# Patient Record
Sex: Female | Born: 1950 | Race: White | Hispanic: No | Marital: Married | Smoking: Never smoker
Health system: Southern US, Community
[De-identification: ages and names within clinical notes are randomized; demographics above are authoritative.]

## PROBLEM LIST (undated history)

## (undated) DIAGNOSIS — E785 Hyperlipidemia, unspecified: Secondary | ICD-10-CM

## (undated) DIAGNOSIS — N200 Calculus of kidney: Secondary | ICD-10-CM

## (undated) DIAGNOSIS — Z9289 Personal history of other medical treatment: Secondary | ICD-10-CM

## (undated) DIAGNOSIS — R9431 Abnormal electrocardiogram [ECG] [EKG]: Secondary | ICD-10-CM

## (undated) DIAGNOSIS — M810 Age-related osteoporosis without current pathological fracture: Secondary | ICD-10-CM

## (undated) DIAGNOSIS — E079 Disorder of thyroid, unspecified: Secondary | ICD-10-CM

## (undated) DIAGNOSIS — I10 Essential (primary) hypertension: Secondary | ICD-10-CM

## (undated) DIAGNOSIS — R3129 Other microscopic hematuria: Secondary | ICD-10-CM

## (undated) DIAGNOSIS — E039 Hypothyroidism, unspecified: Secondary | ICD-10-CM

## (undated) DIAGNOSIS — Z87442 Personal history of urinary calculi: Secondary | ICD-10-CM

## (undated) HISTORY — DX: Abnormal electrocardiogram (ECG) (EKG): R94.31

## (undated) HISTORY — DX: Calculus of kidney: N20.0

## (undated) HISTORY — DX: Personal history of other medical treatment: Z92.89

## (undated) HISTORY — DX: Disorder of thyroid, unspecified: E07.9

## (undated) HISTORY — PX: CHOLECYSTECTOMY: SHX55

## (undated) HISTORY — DX: Hyperlipidemia, unspecified: E78.5

## (undated) HISTORY — DX: Age-related osteoporosis without current pathological fracture: M81.0

## (undated) HISTORY — DX: Essential (primary) hypertension: I10

## (undated) HISTORY — PX: COLONOSCOPY: SHX174

## (undated) HISTORY — PX: LITHOTRIPSY: SUR834

---

## 2005-06-11 ENCOUNTER — Ambulatory Visit: Payer: Self-pay | Admitting: Unknown Physician Specialty

## 2006-07-26 ENCOUNTER — Other Ambulatory Visit: Payer: Self-pay

## 2006-07-26 ENCOUNTER — Emergency Department: Payer: Self-pay | Admitting: Emergency Medicine

## 2007-11-01 ENCOUNTER — Ambulatory Visit: Payer: Self-pay | Admitting: Unknown Physician Specialty

## 2007-11-03 ENCOUNTER — Ambulatory Visit: Payer: Self-pay | Admitting: Unknown Physician Specialty

## 2008-12-05 ENCOUNTER — Ambulatory Visit: Payer: Self-pay | Admitting: Unknown Physician Specialty

## 2009-12-12 ENCOUNTER — Ambulatory Visit: Payer: Self-pay | Admitting: Unknown Physician Specialty

## 2010-01-02 ENCOUNTER — Ambulatory Visit: Payer: Self-pay | Admitting: Urology

## 2010-12-16 ENCOUNTER — Ambulatory Visit: Payer: Self-pay | Admitting: Unknown Physician Specialty

## 2011-06-30 ENCOUNTER — Ambulatory Visit: Payer: Self-pay | Admitting: Unknown Physician Specialty

## 2011-07-15 ENCOUNTER — Ambulatory Visit: Payer: Self-pay | Admitting: Surgery

## 2011-07-16 LAB — PATHOLOGY REPORT

## 2012-01-14 ENCOUNTER — Ambulatory Visit: Payer: Self-pay | Admitting: Unknown Physician Specialty

## 2013-02-07 ENCOUNTER — Ambulatory Visit: Payer: Self-pay | Admitting: Unknown Physician Specialty

## 2013-04-21 ENCOUNTER — Ambulatory Visit: Payer: Self-pay | Admitting: Gastroenterology

## 2013-04-24 LAB — PATHOLOGY REPORT

## 2013-08-30 ENCOUNTER — Ambulatory Visit: Payer: Self-pay

## 2013-10-06 ENCOUNTER — Ambulatory Visit: Payer: Self-pay

## 2014-02-21 ENCOUNTER — Ambulatory Visit: Payer: Self-pay | Admitting: Internal Medicine

## 2014-02-22 DIAGNOSIS — H9201 Otalgia, right ear: Secondary | ICD-10-CM | POA: Insufficient documentation

## 2014-02-22 DIAGNOSIS — F329 Major depressive disorder, single episode, unspecified: Secondary | ICD-10-CM | POA: Insufficient documentation

## 2014-02-22 DIAGNOSIS — F32A Depression, unspecified: Secondary | ICD-10-CM | POA: Insufficient documentation

## 2014-03-13 DIAGNOSIS — B029 Zoster without complications: Secondary | ICD-10-CM | POA: Insufficient documentation

## 2014-03-13 DIAGNOSIS — M792 Neuralgia and neuritis, unspecified: Secondary | ICD-10-CM | POA: Insufficient documentation

## 2014-03-17 ENCOUNTER — Emergency Department: Payer: Self-pay | Admitting: Emergency Medicine

## 2014-03-17 LAB — URINALYSIS, COMPLETE
BACTERIA: NONE SEEN
BILIRUBIN, UR: NEGATIVE
Blood: NEGATIVE
GLUCOSE, UR: NEGATIVE mg/dL (ref 0–75)
Ketone: NEGATIVE
LEUKOCYTE ESTERASE: NEGATIVE
Nitrite: NEGATIVE
Ph: 6 (ref 4.5–8.0)
Protein: NEGATIVE
RBC,UR: 2 /HPF (ref 0–5)
Specific Gravity: 1.017 (ref 1.003–1.030)
Squamous Epithelial: <1

## 2014-03-17 LAB — COMPREHENSIVE METABOLIC PANEL
ALT: 30 U/L (ref 12–78)
Albumin: 3.8 g/dL (ref 3.4–5.0)
Alkaline Phosphatase: 58 U/L
Anion Gap: 5 — ABNORMAL LOW (ref 7–16)
BUN: 22 mg/dL — ABNORMAL HIGH (ref 7–18)
Bilirubin,Total: 1.2 mg/dL — ABNORMAL HIGH (ref 0.2–1.0)
Calcium, Total: 9.1 mg/dL (ref 8.5–10.1)
Chloride: 107 mmol/L (ref 98–107)
Co2: 26 mmol/L (ref 21–32)
Creatinine: 0.84 mg/dL (ref 0.60–1.30)
EGFR (African American): >60
Glucose: 95 mg/dL (ref 65–99)
OSMOLALITY: 279 (ref 275–301)
Potassium: 4.5 mmol/L (ref 3.5–5.1)
SGOT(AST): 32 U/L (ref 15–37)
Sodium: 138 mmol/L (ref 136–145)
TOTAL PROTEIN: 7.3 g/dL (ref 6.4–8.2)

## 2014-03-17 LAB — CBC WITH DIFFERENTIAL/PLATELET
Basophil #: 0.1 10*3/uL (ref 0.0–0.1)
Basophil %: 0.8 %
EOS ABS: 0.1 10*3/uL (ref 0.0–0.7)
Eosinophil %: 0.9 %
HCT: 42 % (ref 35.0–47.0)
HGB: 14.1 g/dL (ref 12.0–16.0)
LYMPHS PCT: 27.5 %
Lymphocyte #: 2.5 10*3/uL (ref 1.0–3.6)
MCH: 31.4 pg (ref 26.0–34.0)
MCHC: 33.6 g/dL (ref 32.0–36.0)
MCV: 94 fL (ref 80–100)
MONOS PCT: 3.6 %
Monocyte #: 0.3 x10 3/mm (ref 0.2–0.9)
Neutrophil #: 6.1 10*3/uL (ref 1.4–6.5)
Neutrophil %: 67.2 %
Platelet: 206 10*3/uL (ref 150–440)
RBC: 4.48 10*6/uL (ref 3.80–5.20)
RDW: 12.8 % (ref 11.5–14.5)
WBC: 9.1 10*3/uL (ref 3.6–11.0)

## 2014-07-04 ENCOUNTER — Ambulatory Visit: Payer: Self-pay | Admitting: Urology

## 2014-07-05 ENCOUNTER — Ambulatory Visit: Payer: Self-pay | Admitting: Urology

## 2014-09-05 DIAGNOSIS — E782 Mixed hyperlipidemia: Secondary | ICD-10-CM | POA: Insufficient documentation

## 2015-01-31 ENCOUNTER — Other Ambulatory Visit: Payer: Self-pay | Admitting: Family Medicine

## 2015-02-22 ENCOUNTER — Other Ambulatory Visit: Payer: Self-pay | Admitting: Internal Medicine

## 2015-02-22 DIAGNOSIS — Z6832 Body mass index (BMI) 32.0-32.9, adult: Secondary | ICD-10-CM | POA: Insufficient documentation

## 2015-02-22 DIAGNOSIS — Z1231 Encounter for screening mammogram for malignant neoplasm of breast: Secondary | ICD-10-CM

## 2015-03-12 ENCOUNTER — Ambulatory Visit: Payer: Self-pay

## 2015-03-13 ENCOUNTER — Ambulatory Visit
Admission: RE | Admit: 2015-03-13 | Discharge: 2015-03-13 | Disposition: A | Discharge status: Home / Self Care | Payer: 59 | Source: Ambulatory Visit | Attending: Internal Medicine | Admitting: Internal Medicine

## 2015-03-13 DIAGNOSIS — Z1231 Encounter for screening mammogram for malignant neoplasm of breast: Secondary | ICD-10-CM | POA: Diagnosis not present

## 2015-05-28 DIAGNOSIS — F329 Major depressive disorder, single episode, unspecified: Secondary | ICD-10-CM | POA: Insufficient documentation

## 2015-05-28 DIAGNOSIS — F32A Depression, unspecified: Secondary | ICD-10-CM | POA: Insufficient documentation

## 2015-05-28 DIAGNOSIS — J3089 Other allergic rhinitis: Secondary | ICD-10-CM | POA: Insufficient documentation

## 2015-05-28 DIAGNOSIS — Z6833 Body mass index (BMI) 33.0-33.9, adult: Secondary | ICD-10-CM | POA: Insufficient documentation

## 2015-06-14 ENCOUNTER — Other Ambulatory Visit: Payer: Self-pay | Admitting: Family Medicine

## 2016-05-01 ENCOUNTER — Other Ambulatory Visit: Payer: Self-pay | Admitting: Internal Medicine

## 2016-05-01 DIAGNOSIS — Z1239 Encounter for other screening for malignant neoplasm of breast: Secondary | ICD-10-CM

## 2016-05-02 DIAGNOSIS — E782 Mixed hyperlipidemia: Secondary | ICD-10-CM | POA: Insufficient documentation

## 2016-05-02 DIAGNOSIS — E785 Hyperlipidemia, unspecified: Secondary | ICD-10-CM | POA: Insufficient documentation

## 2016-05-20 ENCOUNTER — Ambulatory Visit
Admission: RE | Admit: 2016-05-20 | Discharge: 2016-05-20 | Disposition: A | Discharge status: Home / Self Care | Payer: 59 | Source: Ambulatory Visit | Attending: Internal Medicine | Admitting: Internal Medicine

## 2016-05-20 ENCOUNTER — Other Ambulatory Visit: Payer: Self-pay | Admitting: Internal Medicine

## 2016-05-20 DIAGNOSIS — R928 Other abnormal and inconclusive findings on diagnostic imaging of breast: Secondary | ICD-10-CM | POA: Diagnosis not present

## 2016-05-20 DIAGNOSIS — Z1231 Encounter for screening mammogram for malignant neoplasm of breast: Secondary | ICD-10-CM | POA: Insufficient documentation

## 2016-05-20 DIAGNOSIS — Z1239 Encounter for other screening for malignant neoplasm of breast: Secondary | ICD-10-CM

## 2016-05-22 ENCOUNTER — Other Ambulatory Visit: Payer: Self-pay | Admitting: Internal Medicine

## 2016-05-22 DIAGNOSIS — N6310 Unspecified lump in the right breast, unspecified quadrant: Secondary | ICD-10-CM

## 2016-06-10 ENCOUNTER — Ambulatory Visit
Admission: RE | Admit: 2016-06-10 | Discharge: 2016-06-10 | Disposition: A | Discharge status: Home / Self Care | Payer: 59 | Source: Ambulatory Visit | Attending: Internal Medicine | Admitting: Internal Medicine

## 2016-06-10 DIAGNOSIS — N6310 Unspecified lump in the right breast, unspecified quadrant: Secondary | ICD-10-CM

## 2016-06-10 DIAGNOSIS — N63 Unspecified lump in breast: Secondary | ICD-10-CM | POA: Diagnosis present

## 2016-06-10 DIAGNOSIS — R928 Other abnormal and inconclusive findings on diagnostic imaging of breast: Secondary | ICD-10-CM | POA: Diagnosis not present

## 2016-08-03 DIAGNOSIS — N183 Chronic kidney disease, stage 3 unspecified: Secondary | ICD-10-CM | POA: Insufficient documentation

## 2016-08-09 DIAGNOSIS — F325 Major depressive disorder, single episode, in full remission: Secondary | ICD-10-CM | POA: Insufficient documentation

## 2017-04-28 DIAGNOSIS — J3089 Other allergic rhinitis: Secondary | ICD-10-CM | POA: Insufficient documentation

## 2017-04-28 DIAGNOSIS — I10 Essential (primary) hypertension: Secondary | ICD-10-CM | POA: Insufficient documentation

## 2017-05-05 ENCOUNTER — Ambulatory Visit: Payer: 59 | Admitting: Internal Medicine

## 2017-05-06 ENCOUNTER — Ambulatory Visit (INDEPENDENT_AMBULATORY_CARE_PROVIDER_SITE_OTHER): Payer: Managed Care, Other (non HMO) | Admitting: Internal Medicine

## 2017-05-06 ENCOUNTER — Other Ambulatory Visit: Payer: Self-pay | Admitting: Internal Medicine

## 2017-05-06 ENCOUNTER — Encounter: Payer: Self-pay | Admitting: Internal Medicine

## 2017-05-06 ENCOUNTER — Other Ambulatory Visit
Admission: RE | Admit: 2017-05-06 | Discharge: 2017-05-06 | Disposition: A | Discharge status: Home / Self Care | Payer: Managed Care, Other (non HMO) | Source: Ambulatory Visit | Attending: Internal Medicine | Admitting: Internal Medicine

## 2017-05-06 VITALS — BP 164/74 | HR 84 | Ht 66.0 in | Wt 205.2 lb

## 2017-05-06 DIAGNOSIS — I10 Essential (primary) hypertension: Secondary | ICD-10-CM | POA: Insufficient documentation

## 2017-05-06 DIAGNOSIS — R519 Headache, unspecified: Secondary | ICD-10-CM | POA: Insufficient documentation

## 2017-05-06 DIAGNOSIS — R51 Headache: Secondary | ICD-10-CM | POA: Diagnosis not present

## 2017-05-06 DIAGNOSIS — R609 Edema, unspecified: Secondary | ICD-10-CM

## 2017-05-06 DIAGNOSIS — R0602 Shortness of breath: Secondary | ICD-10-CM | POA: Diagnosis present

## 2017-05-06 DIAGNOSIS — R0609 Other forms of dyspnea: Secondary | ICD-10-CM | POA: Insufficient documentation

## 2017-05-06 LAB — URINALYSIS, COMPLETE (UACMP) WITH MICROSCOPIC
BACTERIA UA: NONE SEEN
BILIRUBIN URINE: NEGATIVE
Glucose, UA: NEGATIVE mg/dL
KETONES UR: NEGATIVE mg/dL
Leukocytes, UA: NEGATIVE
NITRITE: NEGATIVE
Protein, ur: >=300 mg/dL — AB
SPECIFIC GRAVITY, URINE: 1.019 (ref 1.005–1.030)
SQUAMOUS EPITHELIAL / LPF: NONE SEEN
pH: 6 (ref 5.0–8.0)

## 2017-05-06 LAB — COMPREHENSIVE METABOLIC PANEL
ALBUMIN: 4.2 g/dL (ref 3.5–5.0)
ALT: 25 U/L (ref 14–54)
AST: 26 U/L (ref 15–41)
Alkaline Phosphatase: 52 U/L (ref 38–126)
Anion gap: 10 (ref 5–15)
BUN: 22 mg/dL — ABNORMAL HIGH (ref 6–20)
CALCIUM: 9.3 mg/dL (ref 8.9–10.3)
CHLORIDE: 106 mmol/L (ref 101–111)
CO2: 25 mmol/L (ref 22–32)
CREATININE: 1.02 mg/dL — AB (ref 0.44–1.00)
GFR calc non Af Amer: 57 mL/min — ABNORMAL LOW (ref >60)
GLUCOSE: 100 mg/dL — AB (ref 65–99)
Potassium: 4.2 mmol/L (ref 3.5–5.1)
SODIUM: 141 mmol/L (ref 135–145)
Total Bilirubin: 1.7 mg/dL — ABNORMAL HIGH (ref 0.3–1.2)
Total Protein: 7.4 g/dL (ref 6.5–8.1)

## 2017-05-06 MED ORDER — HYDROCHLOROTHIAZIDE 25 MG PO TABS: 25.0000 mg | ORAL_TABLET | Freq: Every day | ORAL | 3 refills | Status: DC

## 2017-05-06 NOTE — Progress Notes (Signed)
New Outpatient Visit Date: 05/06/2017  Referring Provider: Linus Salmonshapman McQueen, MD [facility] [address] [place], [place] [id number]  Chief Complaint: Elevated blood pressure  HPI:  [name] is a 66 y.o. female who is being seen today for the evaluation of uncontrolled hypertension at the request of Jenne CampusMcQueen. She has a history of hypertension, hyperlipidemia, chronic kidney disease stage III, hypothyroidism, and obesity. She was seen by Dr. Jenne CampusMcQueen yesterday and was noted to have markedly elevated blood pressure (184/98). She has therefore been referred for urgent consultation.  [name] reports a 20 year history of hypertension that had been controlled with lisinopril up until about 3-4 months ago. At that time, she began having intermittent headaches and noted that her blood pressure was frequently elevated. Lisinopril was discontinued by her PCP and several additional agents have since been tried. Her home blood pressures have remained elevated, typically around 160/60-90. Most recently, she was started on hydralazine and nifedipine but has not experienced any significant improvement in her pressures. [name] has also noted progressive leg swelling over the last few months as well as some exertional shortness of breath when walking or going up stairs. She denies chest pain as well as orthopnea and PND. She notes occasional brief palpitations that she describes as a short pause. These have been present for at least a year. Since her headaches began, she is also noted intermittent lightheadedness. She has not passed out or fallen. She has been trying to drink more water and has noted increased urinary frequency. She consumes 3 cups of caffeinated coffee per day.  [name] describes frequent headaches that are present off and on throughout the day and even at night. They are bilateral and involve the frontal area as well as the occiput. She has not had any focal  weakness or paresthesia. She reports that ENT evaluation yesterday by Dr. Jenne CampusMcQueen was unrevealing. She has not undergone any brain imaging since onset of headaches.  [name] denies a history of prior cardiac disease. She reports having undergone an exercise stress echocardiogram about 3 years ago at [facility], which she believes was normal.  --------------------------------------------------------------------------------------------------  Cardiovascular History & Procedures: Cardiovascular Problems:  Uncontrolled hypertension  Risk Factors:  Hypertension, hyperlipidemia, and obesity  Cath/PCI:  None  CV Surgery:  None  EP Procedures and Devices:  None  Non-Invasive Evaluation(s):  Stress echocardiogram at [facility] approximately 3 years ago: Normal per patient report.  Recent CV Pertinent Labs: Lab Results  Component Value Date   K 4.5 03/17/2014   BUN 22 (H) 03/17/2014   CREATININE 0.84 03/17/2014    --------------------------------------------------------------------------------------------------  Past Medical History:  Diagnosis Date  . Hyperlipidemia   . Hypertension   . Osteoporosis     Past Surgical History:  Procedure Laterality Date  . CHOLECYSTECTOMY      Current Meds  Medication Sig  . escitalopram (LEXAPRO) 20 MG tablet TAKE 1 TABLET BY MOUTH ONCE DAILY  . fluticasone (FLONASE) 50 MCG/ACT nasal spray Place 1 spray into both nostrils daily.   . hydrALAZINE (APRESOLINE) 25 MG tablet Take 25 mg by mouth 3 (three) times daily.   Marland Kitchen. levothyroxine (SYNTHROID, LEVOTHROID) 50 MCG tablet Take 50 mcg by mouth daily before breakfast.   . lisinopril (PRINIVIL,ZESTRIL) 40 MG tablet TAKE 1 TABLET BY MOUTH TWO  TIMES DAILY  . Multiple Vitamin (MULTI-VITAMINS) TABS Take by mouth.  Marland Kitchen. NIFEdipine (PROCARDIA-XL/ADALAT CC) 30 MG 24 hr tablet Take one tablet  for two days then take 2 tablets daily.  . simvastatin (ZOCOR) 20 MG tablet TAKE 1 TABLET BY  MOUTH  NIGHTLY    Allergies: Codeine sulfate; Epinephrine; and Sulfa antibiotics  Social History   Social History  . Marital status: Married    Spouse name: N/A  . Number of children: N/A  . Years of education: N/A   Occupational History  . Not on file.   Social History Main Topics  . Smoking status: Never Smoker  . Smokeless tobacco: Never Used  . Alcohol use No  . Drug use: No  . Sexual activity: Not on file   Other Topics Concern  . Not on file   Social History Narrative  . No narrative on file    Family History  Problem Relation Age of Onset  . Cancer Brother   . Heart attack Father 28  . Breast cancer Neg Hx     Review of Systems: Ms. Rouillard noted some occasional cramping in her right leg when sitting still for extended periods (especially when driving). otherwise, a a12-system review of systems was performed and was negative except as noted in the HPI.  --------------------------------------------------------------------------------------------------  Physical Exam: BP (!) 164/74 (BP Location: Right Arm, Patient Position: Sitting, Cuff Size: Normal)   Pulse 84   Ht 5\' 6"  (1.676 m)   Wt 205 lb 4 oz (93.1 kg)   BMI 33.13 kg/m   General:  Obese woman, seated comfortably in the exam room. She is accompanied by her husband. HEENT: No conjunctival pallor or scleral icterus. Moist mucous membranes. OP clear. Neck: Supple without lymphadenopathy, thyromegaly, JVD, or HJR. No carotid bruit. Lungs: Normal work of breathing. Clear to auscultation bilaterally without wheezes or crackles. Heart: Regular rate and rhythm without murmurs, rubs, or gallops. Unable to assess PMI due to body habitus. Abd: Bowel sounds present. Soft, NT/ND without hepatosplenomegaly. Ext: 2+ pitting edema to the mid calves bilaterally. Radial, PT, and DP pulses are 2+ bilaterally Skin: Warm and dry without rash. Neuro: CNIII-XII intact. Strength and fine-touch sensation intact in upper and  lower extremities bilaterally. Psych: Normal mood and affect.  EKG:  Normal sinus rhythm with poor R-wave progression, inferior Q waves, and nonspecific ST/T changes. Poor R-wave progression and nonspecific ST/T changes are new since 07/04/14.  Lab Results  Component Value Date   WBC 9.1 03/17/2014   HGB 14.1 03/17/2014   HCT 42.0 03/17/2014   MCV 94 03/17/2014   PLT 206 03/17/2014    Lab Results  Component Value Date   NA 138 03/17/2014   K 4.5 03/17/2014   CL 107 03/17/2014   CO2 26 03/17/2014   BUN 22 (H) 03/17/2014   CREATININE 0.84 03/17/2014   GLUCOSE 95 03/17/2014   ALT 30 03/17/2014    No results found for: CHOL, HDL, LDLCALC, LDLDIRECT, TRIG, CHOLHDL  Outside labs (11/24/16): Sodium 143, potassium 4.5, chloride 106, CO2 31, BUN 21, creatinine 1.0, glucose 98, calcium 9.3, AST 16, ALT 14, alkaline phosphatase 41, total bilirubin 1.1, total protein 6.4, albumin 3.9.  --------------------------------------------------------------------------------------------------  ASSESSMENT AND PLAN: Uncontrolled hypertension  Blood pressure remains elevated today, albeit somewhat improved from yesterday. Given reported dramatic rise in blood pressure over the last 4 months or so, we must be concerned about a potential secondary causes. I would like to begin with CMP, urinalysis, serum aldosterone, plasma renin activity, and renal artery Doppler. I have also recommended a transthoracic echocardiogram to assess for structural abnormalities, particularly given accompanying dyspnea on exertion  and edema. We will initiate hydrochlorothiazide 25 mg daily today. I will not make any changes to nifedipine or hydralazine at this time.  Dyspnea on exertion This could be due to a number of factors including fluid retention and uncontrolled hypertension. EKG is abnormal with inferior Q waves (old) poor R-wave progression, nonspecific ST/T changes. We have agreed to evaluate this further with a  transthoracic echocardiogram. Based on the results, we may need to consider further ischemia evaluation with myocardial perfusion stress test or cardiac catheterization.  Lower extremity edema 2+ pitting edema noted in the legs up to the mid calves. This may of been exacerbated by addition of nifedipine. As above, I will check a CMP and urinalysis to exclude underlying liver and kidney pathology that may be contributing to this. We will also obtain a transthoracic echocardiogram. If these are unrevealing and edema persists, we will need to try switching from nifedipine to an alternative agent. Hopefully, addition of HCTZ will help as well.  Headaches These could be due to a number of factors. I am not sure that hypertension alone explains these. If headaches persist despite improved blood pressure control, I would suggest further workup, including brain imaging. I will defer this to [name].  Follow-up: Return to clinic in 1 month.  [name], MD 05/06/2017 8:30 AM

## 2017-05-06 NOTE — Patient Instructions (Signed)
Medication Instructions:  Your physician has recommended you make the following change in your medication:  1- START Hydrochlorothiazide (HCTZ) 25 mg (1 tablet) by mouth once a day.   Labwork: Your physician recommends that you return for lab work in: TODAY (CMP, Serum aldosterone/renin ratio, Urinalysis) - Please [facility] to the [facility]. You will check in at the front desk to the right as you walk into the atrium. Valet Parking is offered if needed.    Testing/Procedures: Your physician has requested that you have an echocardiogram. Echocardiography is a painless test that uses sound waves to create images of your heart. It provides your doctor with information about the size and shape of your heart and how well your heart's chambers and valves are working. This procedure takes approximately one hour. There are no restrictions for this procedure.  Your physician has requested that you have a renal artery duplex. During this test, an ultrasound is used to evaluate blood flow to the kidneys. Allow one hour for this exam. Do not eat after midnight the day before and avoid carbonated beverages. Take your medications as you usually do.    Follow-Up: Your physician recommends that you schedule a follow-up appointment in: 1 MONTH WITH DR END.  If you need a refill on your cardiac medications before your next appointment, please call your pharmacy.    Echocardiogram An echocardiogram, or echocardiography, uses sound waves (ultrasound) to produce an image of your heart. The echocardiogram is simple, painless, obtained within a short period of time, and offers valuable information to your health care provider. The images from an echocardiogram can provide information such as:  Evidence of coronary artery disease (CAD).  Heart size.  Heart muscle function.  Heart valve function.  Aneurysm detection.  Evidence of a past heart attack.  Fluid buildup around the heart.  Heart muscle  thickening.  Assess heart valve function.  Tell a health care provider about:  Any allergies you have.  All medicines you are taking, including vitamins, herbs, eye drops, creams, and over-the-counter medicines.  Any problems you or family members have had with anesthetic medicines.  Any blood disorders you have.  Any surgeries you have had.  Any medical conditions you have.  Whether you are pregnant or may be pregnant. What happens before the procedure? No special preparation is needed. Eat and drink normally. What happens during the procedure?  In order to produce an image of your heart, gel will be applied to your chest and a wand-like tool (transducer) will be moved over your chest. The gel will help transmit the sound waves from the transducer. The sound waves will harmlessly bounce off your heart to allow the heart images to be captured in real-time motion. These images will then be recorded.  You may need an IV to receive a medicine that improves the quality of the pictures. What happens after the procedure? You may return to your normal schedule including diet, activities, and medicines, unless your health care provider tells you otherwise. This information is not intended to replace advice given to you by your health care provider. Make sure you discuss any questions you have with your health care provider. Document Released: 09/11/2000 Document Revised: 05/02/2016 Document Reviewed: 05/22/2013 Elsevier Interactive Patient Education  2017 [employer]

## 2017-05-10 ENCOUNTER — Telehealth: Payer: Self-pay | Admitting: *Deleted

## 2017-05-10 LAB — ALDOSTERONE + RENIN ACTIVITY W/ RATIO
ALDO / PRA RATIO: 8.7 (ref 0.0–30.0)
ALDOSTERONE: 19.3 ng/dL (ref 0.0–30.0)
PRA LC/MS/MS: 2.215 ng/mL/hr (ref 0.167–5.380)

## 2017-05-10 NOTE — Telephone Encounter (Signed)
Returned call to patient. She verbalized understanding of results and plan of care. She verbalized understanding of appt date and time tomorrow with Dr Thedore MinsSingh. [facility] to [facility] provided to patient.

## 2017-05-10 NOTE — Telephone Encounter (Signed)
No answer. Left message to call back.   

## 2017-05-10 NOTE — Telephone Encounter (Signed)
Pt is returning your call

## 2017-05-10 NOTE — Telephone Encounter (Signed)
-----   Message from Yvonne Kendallhristopher End, MD sent at 05/08/2017  2:26 PM EDT ----- Please let [name] know that her blood and urine tests show mildly diminished kidney function as well as significant protein and microscopic blood in her urine. It is possible that underlying kidney issues have contributed to her recently hard to control hypertension. Please set her up for an urgent nephrology consultation this week to assess for nephrotic and/or nephritic syndrome. Thanks.

## 2017-05-10 NOTE — Telephone Encounter (Signed)
S/w Mandi at [facility] and placed urgent referral.  Appt scheduled for patient to see Dr Thedore MinsSingh on 05/11/17 at 2:40, arrival time of 2:20pm. Patient needs to bring medications and be prepared for a urine sample.   Left message with patient to call me for results and appt.

## 2017-05-17 ENCOUNTER — Ambulatory Visit: Payer: Managed Care, Other (non HMO)

## 2017-05-17 DIAGNOSIS — I10 Essential (primary) hypertension: Secondary | ICD-10-CM

## 2017-05-17 DIAGNOSIS — R609 Edema, unspecified: Secondary | ICD-10-CM

## 2017-05-17 DIAGNOSIS — R0602 Shortness of breath: Secondary | ICD-10-CM

## 2017-05-19 ENCOUNTER — Other Ambulatory Visit: Payer: Self-pay | Admitting: Nephrology

## 2017-05-19 DIAGNOSIS — R319 Hematuria, unspecified: Secondary | ICD-10-CM

## 2017-05-20 ENCOUNTER — Other Ambulatory Visit: Payer: Self-pay | Admitting: *Deleted

## 2017-05-20 ENCOUNTER — Telehealth: Payer: Self-pay | Admitting: *Deleted

## 2017-05-20 DIAGNOSIS — R0609 Other forms of dyspnea: Principal | ICD-10-CM

## 2017-05-20 NOTE — Telephone Encounter (Signed)
S/w receptionist at Dr Doristine Church office and she will request patient's last office visit notes, test, etc to be faxed to our office.

## 2017-05-20 NOTE — Telephone Encounter (Signed)
S/w patient. She verbalized understanding of results and plan of care. She verbalized understanding of Lexiscan myoview and was agreeable. Patient scheduled for Lexiscan on 06/08/17 at 0730. Patient did go to appt with Dr Thedore Mins on 05/11/17 and has additional appt to come including CT abdomin on 06/07/17. I will reach out to Dr Doristine Church office as office visit is not viewable in EPIC at this time.   Patient verbalized understanding of the instructions below as well:  [facility] MYOVIEW  Your caregiver has ordered a Stress Test with nuclear imaging. The purpose of this test is to evaluate the blood supply to your heart muscle. This procedure is referred to as a "Non-Invasive Stress Test." This is because other than having an IV started in your vein, nothing is inserted or "invades" your body. Cardiac stress tests are done to find areas of poor blood flow to the heart by determining the extent of coronary artery disease (CAD). Some patients exercise on a treadmill, which naturally increases the blood flow to your heart, while others who are  unable to walk on a treadmill due to physical limitations have a pharmacologic/chemical stress agent called Lexiscan . This medicine will mimic walking on a treadmill by temporarily increasing your coronary blood flow.   Please note: these test may take anywhere between 2-4 hours to complete  PLEASE REPORT TO [facility] ENTRANCE  THE VOLUNTEERS AT THE FIRST DESK WILL DIRECT YOU WHERE TO GO  Date of Procedure:_______09/11/18____________  Arrival Time for Procedure:______07:15 am  Instructions regarding medication:   Please take your usual morning medications with a small sip of water.   PLEASE NOTIFY THE OFFICE AT LEAST 24 HOURS IN ADVANCE IF YOU ARE UNABLE TO KEEP YOUR APPOINTMENT.  [phone] AND  PLEASE NOTIFY NUCLEAR MEDICINE AT [facility] AT LEAST 24 HOURS IN ADVANCE IF YOU ARE UNABLE TO KEEP YOUR APPOINTMENT. [phone]  How to prepare for your Myoview  test:  1. Do not eat or drink after midnight 2. No caffeine for 24 hours prior to test 3. No smoking 24 hours prior to test. 4. Your medication may be taken with water.  If your doctor stopped a medication because of this test, do not take that medication. 5. Ladies, please do not wear dresses.  Skirts or pants are appropriate. Please wear a short sleeve shirt. 6. No perfume, cologne or lotion. 7. Wear comfortable walking shoes. No heels!

## 2017-05-20 NOTE — Telephone Encounter (Signed)
-----   Message from [name], MD sent at 05/20/2017 10:38 AM EDT ----- Please let Ms. Munir know that her echo is normal. Given her exertional dyspnea, can we set her up for pharmacologic myocardial perfusion stress test? Also, do you know if we ever received a report from nephrology from our recent referral? Thanks.  Thayer Ohm

## 2017-06-01 NOTE — Telephone Encounter (Signed)
Requested office visit notes from Dr Leavy CellaJasmine Singh's office from 05/27/17. Then realized patient visit is viewable in EPIC for Dr Leotis ShamesJasmine Singh.   Will call Dr Mosetta PigeonHarmeet Singh with [facility].  Left message with nurse to fax over patient office visit notes and any additional information.

## 2017-06-02 NOTE — Telephone Encounter (Signed)
Records from Dr. Edmonia JamesHameet Singh and Dr. Leotis ShamesJasmine Singh for Dr End to review. Placed in his basket.

## 2017-06-07 ENCOUNTER — Ambulatory Visit
Admission: RE | Admit: 2017-06-07 | Discharge: 2017-06-07 | Disposition: A | Discharge status: Home / Self Care | Payer: Managed Care, Other (non HMO) | Source: Ambulatory Visit | Attending: Nephrology | Admitting: Nephrology

## 2017-06-07 DIAGNOSIS — K5730 Diverticulosis of large intestine without perforation or abscess without bleeding: Secondary | ICD-10-CM | POA: Diagnosis not present

## 2017-06-07 DIAGNOSIS — M419 Scoliosis, unspecified: Secondary | ICD-10-CM | POA: Diagnosis not present

## 2017-06-07 DIAGNOSIS — I700 Atherosclerosis of aorta: Secondary | ICD-10-CM | POA: Diagnosis not present

## 2017-06-07 DIAGNOSIS — K449 Diaphragmatic hernia without obstruction or gangrene: Secondary | ICD-10-CM | POA: Insufficient documentation

## 2017-06-07 DIAGNOSIS — N200 Calculus of kidney: Secondary | ICD-10-CM | POA: Insufficient documentation

## 2017-06-07 DIAGNOSIS — M4317 Spondylolisthesis, lumbosacral region: Secondary | ICD-10-CM | POA: Insufficient documentation

## 2017-06-07 DIAGNOSIS — Z87442 Personal history of urinary calculi: Secondary | ICD-10-CM | POA: Insufficient documentation

## 2017-06-07 DIAGNOSIS — I708 Atherosclerosis of other arteries: Secondary | ICD-10-CM | POA: Insufficient documentation

## 2017-06-07 DIAGNOSIS — R319 Hematuria, unspecified: Secondary | ICD-10-CM

## 2017-06-08 ENCOUNTER — Encounter
Admission: RE | Admit: 2017-06-08 | Discharge: 2017-06-08 | Disposition: A | Discharge status: Home / Self Care | Payer: Managed Care, Other (non HMO) | Source: Ambulatory Visit | Attending: Internal Medicine | Admitting: Internal Medicine

## 2017-06-08 DIAGNOSIS — R0609 Other forms of dyspnea: Secondary | ICD-10-CM

## 2017-06-08 LAB — NM MYOCAR MULTI W/SPECT W/WALL MOTION / EF
CSEPHR: 58 %
CSEPPHR: 92 {beats}/min
LV dias vol: 78 mL (ref 46–106)
LV sys vol: 29 mL
NUC STRESS TID: 1.12
Rest HR: 67 {beats}/min

## 2017-06-08 MED ORDER — TECHNETIUM TC 99M TETROFOSMIN IV KIT
31.8330 | PACK | Freq: Once | INTRAVENOUS | Status: AC | PRN
  Administered 2017-06-08: 31.833 via INTRAVENOUS

## 2017-06-08 MED ORDER — TECHNETIUM TC 99M TETROFOSMIN IV KIT
13.0000 | PACK | Freq: Once | INTRAVENOUS | Status: AC | PRN
  Administered 2017-06-08: 12.159 via INTRAVENOUS

## 2017-06-08 MED ORDER — REGADENOSON 0.4 MG/5ML IV SOLN
0.4000 mg | Freq: Once | INTRAVENOUS | Status: AC
  Administered 2017-06-08: 0.4 mg via INTRAVENOUS

## 2017-06-15 ENCOUNTER — Ambulatory Visit (INDEPENDENT_AMBULATORY_CARE_PROVIDER_SITE_OTHER): Payer: Managed Care, Other (non HMO) | Admitting: Internal Medicine

## 2017-06-15 ENCOUNTER — Encounter: Payer: Self-pay | Admitting: Internal Medicine

## 2017-06-15 VITALS — BP 130/86 | HR 76 | Ht 66.0 in | Wt 205.0 lb

## 2017-06-15 DIAGNOSIS — I10 Essential (primary) hypertension: Secondary | ICD-10-CM | POA: Diagnosis not present

## 2017-06-15 DIAGNOSIS — R809 Proteinuria, unspecified: Secondary | ICD-10-CM | POA: Diagnosis not present

## 2017-06-15 DIAGNOSIS — N182 Chronic kidney disease, stage 2 (mild): Secondary | ICD-10-CM | POA: Diagnosis not present

## 2017-06-15 DIAGNOSIS — R600 Localized edema: Secondary | ICD-10-CM | POA: Diagnosis not present

## 2017-06-15 MED ORDER — NIFEDIPINE ER OSMOTIC RELEASE 30 MG PO TB24: 30.0000 mg | ORAL_TABLET | Freq: Every day | ORAL | 3 refills | Status: DC

## 2017-06-15 NOTE — Patient Instructions (Signed)
Medication Instructions:  Your physician has recommended you make the following change in your medication:  1- DECREASE Nifedipine to 30 mg by mouth once a day.   Labwork: none  Testing/Procedures: none  Follow-Up: Your physician recommends that you schedule a follow-up appointment in: 6 WEEKS WITH DR END.   Any Other Special Instructions Will Be Listed Below (If Applicable).  Call us if blood pressure is greater than 150/90.   If you need a refill on your cardiac medications before your next appointment, please call your pharmacy.

## 2017-06-15 NOTE — Progress Notes (Signed)
Follow-up Outpatient Visit Date: 06/15/2017  Primary Care Provider: Leotis Shames, MD [address] [address] [facility] [place] [address]  Chief Complaint: Leg swelling  HPI:  Tracey Mosley is a 66 y.o. year-old female with history of hypertension, hyperlipidemia, chronic kidney disease stage III, hypothyroidism, and obesity, who presents for follow-up of hypertension. I last saw Ms. Dressel in early August for urgent evaluation of uncontrolled hypertension. Workup at that time revealed proteinuria and microscopic hematuria prompting referral to nephrology. Subsequent evaluation has revealed a left ureteral stone for which neurologic assessment is pending. We added HCTZ to her regimen at that time, which Ms. Sandeen has been tolerating well. She notes that her home blood pressures have improved, though they remain mildly elevated. She also reported exertional dyspnea, with subsequent echocardiogram showing no significant abnormalities. Myocardial perfusion stress test was also low risk without evidence of ischemia or scar.  Today, Tracey Mosley reports feeling well with the exception of some continued leg swelling. She has been exercising and happily reports that she has lost about 5 pounds over the last month. She is also been watching her sodium intake. She denies chest pain, palpitations, lightheadedness, orthopnea, and PND. Exertional dyspnea has also improved.  --------------------------------------------------------------------------------------------------  Cardiovascular History & Procedures: Cardiovascular Problems:  Uncontrolled hypertension  Risk Factors:  Hypertension, hyperlipidemia, and obesity  Cath/PCI:  None.  CV Surgery:  None.  EP Procedures and Devices:  None.  Non-Invasive Evaluation(s):  Pharmacologic MPI (06/08/17): Low risk study without ischemia or scar. Normal LVEF.  TTE (05/18/17): Normal LV size and function. LVEF 55-60%. Normal RV size and  function. No significant valvular abnormalities.  Recent CV Pertinent Labs: Lab Results  Component Value Date   K 4.2 05/06/2017   K 4.5 03/17/2014   BUN 22 (H) 05/06/2017   BUN 22 (H) 03/17/2014   CREATININE 1.02 (H) 05/06/2017   CREATININE 0.84 03/17/2014    Past medical and surgical history were reviewed and updated in EPIC.  Current Meds  Medication Sig  . escitalopram (LEXAPRO) 20 MG tablet TAKE 1 TABLET BY MOUTH ONCE DAILY  . fluticasone (FLONASE) 50 MCG/ACT nasal spray Place 1 spray into both nostrils daily.   . hydrALAZINE (APRESOLINE) 25 MG tablet Take 25 mg by mouth 3 (three) times daily.   . hydrochlorothiazide (HYDRODIURIL) 25 MG tablet Take 1 tablet (25 mg total) by mouth daily.  Marland Kitchen levothyroxine (SYNTHROID, LEVOTHROID) 50 MCG tablet Take 50 mcg by mouth daily before breakfast.   . lisinopril (PRINIVIL,ZESTRIL) 40 MG tablet Take 40 mg by mouth daily.  . Multiple Vitamin (MULTI-VITAMINS) TABS Take by mouth.  . simvastatin (ZOCOR) 20 MG tablet TAKE 1 TABLET BY MOUTH  NIGHTLY  . [DISCONTINUED] NIFEdipine (PROCARDIA-XL/ADALAT CC) 30 MG 24 hr tablet Take one tablet for two days then take 2 tablets daily.    Allergies: Codeine sulfate; Epinephrine; and Sulfa antibiotics  Social History   Social History  . Marital status: Married    Spouse name: N/A  . Number of children: N/A  . Years of education: N/A   Occupational History  . Not on file.   Social History Main Topics  . Smoking status: Never Smoker  . Smokeless tobacco: Never Used  . Alcohol use No  . Drug use: No  . Sexual activity: Not on file   Other Topics Concern  . Not on file   Social History Narrative  . No narrative on file    Family History  Problem Relation Age of Onset  .  Cancer Brother        Abdomina  . Heart disease Mother        leaky valves  . Heart attack Father 44  . Aneurysm Father   . Breast cancer Neg Hx     Review of Systems: A 12-system review of systems was performed  and was negative except as noted in the HPI.  --------------------------------------------------------------------------------------------------  Physical Exam: BP 130/86 (BP Location: Left Arm, Patient Position: Sitting, Cuff Size: Large)   Pulse 76   Ht  (1.676 m)   Wt 205 lb (93 kg)   BMI 33.09 kg/m   General:  Obese woman, seated comfortably in the exam room. She is accompanied by her husband. HEENT: No conjunctival pallor or scleral icterus. Moist mucous membranes.  OP clear. Neck: Supple without lymphadenopathy, thyromegaly, JVD, or HJR. Lungs: Normal work of breathing. Clear to auscultation bilaterally without wheezes or crackles. Heart: Regular rate and rhythm without murmurs, rubs, or gallops. Non-displaced PMI. Abd: Bowel sounds present. Soft, NT/ND without hepatosplenomegaly Ext: 2+ edema to the mid calves. Radial, PT, and DP pulses are 2+ bilaterally. Skin: Warm and dry without rash.  Lab Results  Component Value Date   WBC 9.1 03/17/2014   HGB 14.1 03/17/2014   HCT 42.0 03/17/2014   MCV 94 03/17/2014   PLT 206 03/17/2014    Lab Results  Component Value Date   NA 141 05/06/2017   K 4.2 05/06/2017   CL 106 05/06/2017   CO2 25 05/06/2017   BUN 22 (H) 05/06/2017   CREATININE 1.02 (H) 05/06/2017   GLUCOSE 100 (H) 05/06/2017   ALT 25 05/06/2017    No results found for: CHOL, HDL, LDLCALC, LDLDIRECT, TRIG, CHOLHDL  Outside labs (05/11/17): Sodium 141, potassium 3.7, bicarbonate 27, BUN 20, creatinine 0.97, hemoglobin 14.7, platelets 264, total cholesterol 188, triglycerides 239, HDL 52, LDL 88.  Urine protein to creatinine ratio 2,032 mg/g --------------------------------------------------------------------------------------------------  ASSESSMENT AND PLAN: Hypertension Blood pressure is better controlled today, upper normal to mildly elevated. I will continue HCTZ and lisinopril. We will decrease nifedipine to 30 mg daily in case this is contributing to  her leg edema. I have asked Ms. Cogburn to closely monitor her blood pressure and to let us know if it begins to rise. I encouraged her to continue with exercise and salt restriction.  Lower extremity edema This is likely multifactorial, including side effects from nifedipine as well as underlying kidney disease and proteinuria. Echo did not show any significant abnormalities. As above, we will decrease nifedipine.  Chronic kidney disease with proteinuria Repeat creatinine by [name] after initiation of HCTZ was stable. Significant proteinuria was identified. Workup also revealed a left renal stone. I will defer further workup to [name].  Follow-up: Return to clinic in 6 weeks.  [name], MD 06/16/2017 6:51 AM

## 2017-06-16 ENCOUNTER — Encounter: Payer: Self-pay | Admitting: Internal Medicine

## 2017-06-17 ENCOUNTER — Other Ambulatory Visit: Payer: Self-pay

## 2017-06-17 DIAGNOSIS — N200 Calculus of kidney: Secondary | ICD-10-CM

## 2017-06-18 ENCOUNTER — Encounter: Payer: Self-pay | Admitting: Urology

## 2017-06-18 ENCOUNTER — Other Ambulatory Visit
Admission: RE | Admit: 2017-06-18 | Discharge: 2017-06-18 | Disposition: A | Discharge status: Home / Self Care | Payer: Managed Care, Other (non HMO) | Source: Ambulatory Visit | Attending: Urology | Admitting: Urology

## 2017-06-18 ENCOUNTER — Ambulatory Visit (INDEPENDENT_AMBULATORY_CARE_PROVIDER_SITE_OTHER): Payer: Managed Care, Other (non HMO) | Admitting: Urology

## 2017-06-18 VITALS — BP 134/67 | HR 85 | Ht 66.0 in | Wt 204.0 lb

## 2017-06-18 DIAGNOSIS — R3129 Other microscopic hematuria: Secondary | ICD-10-CM

## 2017-06-18 DIAGNOSIS — N200 Calculus of kidney: Secondary | ICD-10-CM

## 2017-06-18 LAB — URINALYSIS, COMPLETE (UACMP) WITH MICROSCOPIC
Bilirubin Urine: NEGATIVE
Glucose, UA: NEGATIVE mg/dL
Ketones, ur: NEGATIVE mg/dL
Leukocytes, UA: NEGATIVE
Nitrite: NEGATIVE
PH: 6.5 (ref 5.0–8.0)
Protein, ur: 100 mg/dL — AB
SPECIFIC GRAVITY, URINE: 1.025 (ref 1.005–1.030)

## 2017-06-18 NOTE — Progress Notes (Signed)
06/18/2017 9:07 PM   Tracey Mosley 1951/08/21 [id number]  Referring provider: Leotis Shames, MD [address] [facility] [address] [address] [facility] [place], [place] [address]  Chief Complaint  Patient presents with  . Nephrolithiasis    New Patient    HPI: 66 yo F with history of nephrolithiasis referred for evaluation of kidney stone.  She was referred to nephrology ([name]) for her further evaluation of proteinuria and microscopic.  As part of the work up, she underwent CT abdomen pelvis w/o contrast on 05/2017 which showed 6 mm LLP nonobtructing stone.   There was also incidental bladder wall thickening.    She does have occational left flank pain which is mild.  No gross hematuria.    She is s/p ESWL x 2 (~2013 and 2015) in the past for stones previously followed by Dr. Evelene Croon.  She does no know what type of stone she makes.    Never smoker.   No previous hematuria work up of cystoscopy.    PMH: Past Medical History:  Diagnosis Date  . Hyperlipidemia   . Hypertension   . Kidney stones   . Osteoporosis   . Thyroid disease     Surgical History: Past Surgical History:  Procedure Laterality Date  . CHOLECYSTECTOMY    . LITHOTRIPSY      Home Medications:  Allergies as of 06/18/2017      Reactions   Codeine Sulfate Nausea Only   Epinephrine Other (See Comments)   Tremors   Sulfa Antibiotics Nausea And Vomiting      Medication List       Accurate as of 06/18/17 11:59 PM. Always use your most recent med list.          escitalopram 20 MG tablet Commonly known as:  LEXAPRO TAKE 1 TABLET BY MOUTH ONCE DAILY   fluticasone 50 MCG/ACT nasal spray Commonly known as:  FLONASE Place 1 spray into both nostrils daily.   hydrALAZINE 25 MG tablet Commonly known as:  APRESOLINE Take 25 mg by mouth 3 (three) times daily.   hydrochlorothiazide 25 MG tablet Commonly known as:  HYDRODIURIL Take 1 tablet (25 mg total) by mouth daily.   levothyroxine 50 MCG tablet Commonly  known as:  SYNTHROID, LEVOTHROID Take 50 mcg by mouth daily before breakfast.   lisinopril 40 MG tablet Commonly known as:  PRINIVIL,ZESTRIL Take 40 mg by mouth daily.   MULTI-VITAMINS Tabs Take by mouth.   NIFEdipine 30 MG 24 hr tablet Commonly known as:  PROCARDIA-XL/ADALAT-CC/NIFEDICAL-XL Take 1 tablet (30 mg total) by mouth daily.   simvastatin 20 MG tablet Commonly known as:  ZOCOR TAKE 1 TABLET BY MOUTH  NIGHTLY       Allergies:  Allergies  Allergen Reactions  . Codeine Sulfate Nausea Only  . Epinephrine Other (See Comments)    Tremors  . Sulfa Antibiotics Nausea And Vomiting    Family History: Family History  Problem Relation Age of Onset  . Cancer Brother        Abdomina  . Heart disease Mother        leaky valves  . Heart attack Father 26  . Aneurysm Father   . Breast cancer Neg Hx     Social History:  reports that she has never smoked. She has never used smokeless tobacco. She reports that she does not drink alcohol or use drugs.  ROS: UROLOGY Frequent Urination?: No Hard to postpone urination?: No Burning/pain with urination?: No Get up at night to urinate?:  Yes Leakage of urine?: Yes Urine stream starts and stops?: No Trouble starting stream?: No Do you have to strain to urinate?: No Blood in urine?: No Urinary tract infection?: No Sexually transmitted disease?: No Injury to kidneys or bladder?: No Painful intercourse?: No Weak stream?: No Currently pregnant?: No Vaginal bleeding?: No Last menstrual period?: n  Gastrointestinal Nausea?: No Vomiting?: No Indigestion/heartburn?: No Diarrhea?: No Constipation?: No  Constitutional Fever: No Night sweats?: No Weight loss?: No Fatigue?: No  Skin Skin rash/lesions?: No Itching?: No  Eyes Blurred vision?: Yes Double vision?: No  Ears/Nose/Throat Sore throat?: No Sinus problems?: Yes  Hematologic/Lymphatic Swollen glands?: No Easy bruising?: No  Cardiovascular Leg  swelling?: No Chest pain?: No  Respiratory Cough?: No Shortness of breath?: Yes  Endocrine Excessive thirst?: No  Musculoskeletal Back pain?: Yes Joint pain?: No  Neurological Headaches?: Yes Dizziness?: No  Psychologic Depression?: No Anxiety?: Yes  Physical Exam: BP 134/67   Pulse 85   Ht  (1.676 m)   Wt 204 lb (92.5 kg)   BMI 32.93 kg/m   Constitutional:  Alert and oriented, No acute distress. HEENT: [place] AT, moist mucus membranes.  Trachea midline, no masses. Cardiovascular: No clubbing, cyanosis, or edema. Respiratory: Normal respiratory effort, no increased work of breathing. GI: Abdomen is soft, nontender, nondistended, no abdominal masses GU: No CVA tenderness.  Skin: No rashes, bruises or suspicious lesions.. Neurologic: Grossly intact, no focal deficits, moving all 4 extremities. Psychiatric: Normal mood and affect.  Laboratory Data: Lab Results  Component Value Date   WBC 9.1 03/17/2014   HGB 14.1 03/17/2014   HCT 42.0 03/17/2014   MCV 94 03/17/2014   PLT 206 03/17/2014    Lab Results  Component Value Date   CREATININE 1.02 (H) 05/06/2017    Urinalysis Results for orders placed or performed during the hospital encounter of 06/18/17  Urinalysis, Complete w Microscopic  Result Value Ref Range   Color, Urine YELLOW YELLOW   APPearance HAZY (A) CLEAR   Specific Gravity, Urine 1.025 1.005 - 1.030   pH 6.5 5.0 - 8.0   Glucose, UA NEGATIVE NEGATIVE mg/dL   Hgb urine dipstick LARGE (A) NEGATIVE   Bilirubin Urine NEGATIVE NEGATIVE   Ketones, ur NEGATIVE NEGATIVE mg/dL   Protein, ur 409 (A) NEGATIVE mg/dL   Nitrite NEGATIVE NEGATIVE   Leukocytes, UA NEGATIVE NEGATIVE   Squamous Epithelial / LPF 0-5 (A) NONE SEEN   WBC, UA 0-5 0 - 5 WBC/hpf   RBC / HPF 6-30 0 - 5 RBC/hpf   Bacteria, UA FEW (A) NONE SEEN   Granular Casts, UA PRESENT     Pertinent Imaging: CLINICAL DATA:  Microscopic hematuria  EXAM: CT ABDOMEN AND PELVIS WITHOUT  CONTRAST  TECHNIQUE: Multidetector CT imaging of the abdomen and pelvis was performed following the standard protocol without oral or intravenous contrast material administration.  COMPARISON:  March 17, 2014  FINDINGS: Lower chest: There is a stable 5 mm nodular opacity in the lateral segment of the right lower lobe inferiorly. Lung bases otherwise are clear. There is a small hiatal hernia.  Hepatobiliary: No focal liver lesions are evident. Gallbladder wall is not appreciably thickened. Common bile duct is within normal limits given post cholecystectomy state. No biliary duct mass or calculus is appreciable on this study.  Pancreas: There is no pancreatic mass or inflammatory focus.  Spleen: No splenic lesions are evident.  Adrenals/Urinary Tract: Adrenals appear normal bilaterally. There is no appreciable renal mass or hydronephrosis on either side.  There is a 6 x 3 mm nonobstructing calculus in the lower pole of the left kidney. There is no ureteral calculus on either side. Urinary bladder is midline. There is thickening of the urinary bladder wall.  Stomach/Bowel: There is slight sigmoid diverticulosis without diverticulitis. There is no appreciable bowel wall or mesenteric thickening. There is no evident bowel obstruction. No free air or portal venous air.  Vascular/Lymphatic: There is atherosclerotic calcification in the aorta and right common iliac artery. There is no demonstrable aneurysm. Major mesenteric vessels appear patent on this noncontrast enhanced study. There is no appreciable adenopathy in the abdomen or pelvis.  Reproductive: Uterus is anteverted. There is no evident pelvic mass.  Other: Appendix appears normal. There is no abscess or ascites in the abdomen or pelvis. There is thinning of the rectus muscle in the midline. A loop of colon extends into this area without bowel compromise.  Musculoskeletal: There is thoracolumbar  levoscoliosis. There is slight anterolisthesis of L5 on S1 There is degenerative change in the lumbar spine. There are no blastic or lytic bone lesions. There is no intramuscular or abdominal wall lesions.  IMPRESSION: 1. 6 x 3 mm calculus lower pole left kidney without hydronephrosis. There is no hydronephrosis or ureteral calculus on either side.  2. Urinary bladder wall thickening, a finding felt to be indicative of cystitis.  3. Slight sigmoid diverticulosis. No diverticulitis. No bowel wall obstruction. Appendix appears normal. No abscess.  4. Small hiatal hernia. Thinning of the rectus muscle in the midline. Loop of colon extends into this area without bowel compromise.  5.  Aortoiliac atherosclerosis.  6. Scoliosis. Mild spondylolisthesis at L5-S1 with degenerative change in the lumbar spine.  7.  Gallbladder absent.  Aortic Atherosclerosis (ICD10-I70.0).   Electronically Signed   By: Bretta Bang III M.D.   On: 06/07/2017 10:58  CT abd/ pelvis reviewed personally today and with the patient  Assessment & Plan:    1. Nephrolithiasis 6 mm LLP stone, possibly symptomatic at times We discussed various treatment options including ESWL vs. ureteroscopy, laser lithotripsy, and stent vs. Continued observation. We discussed the risks and benefits of both including bleeding, infection, damage to surrounding structures, efficacy with need for possible further intervention, and need for temporary ureteral stent.  We discussed general stone prevention techniques including drinking plenty water with goal of producing 2.5 L urine daily, increased citric acid intake, avoidance of high oxalate containing foods, and decreased salt intake.  Information about dietary recommendations given today.   She would likely to observe for the time being, reassess in the future following work up of #2  2. Microscopic hematuria Likely related to stone or underlying renal disease,  however, strongly recommend cystoscopy  No delayed ureteral imaging, but given non smoking history, low risk for up upper tract TCC Aggreable to cystoscopy/ cytology   Return in about 2 weeks (around 07/02/2017) for cysto.  Vanna Scotland, MD  [facility] [address], [address] [place], [place] [address] [phone]

## 2017-06-18 NOTE — Patient Instructions (Signed)
Dietary Guidelines to Help Prevent Kidney Stones Kidney stones are deposits of minerals and salts that form inside your kidneys. Your risk of developing kidney stones may be greater depending on your diet, your lifestyle, the medicines you take, and whether you have certain medical conditions. Most people can reduce their chances of developing kidney stones by following the instructions below. Depending on your overall health and the type of kidney stones you tend to develop, your dietitian may give you more specific instructions. What are tips for following this plan? Reading food labels   Choose foods with "no salt added" or "low-salt" labels. Limit your sodium intake to less than 1500 mg per day.  Choose foods with calcium for each meal and snack. Try to eat about 300 mg of calcium at each meal. Foods that contain 200-500 mg of calcium per serving include:  8 oz (237 ml) of milk, fortified nondairy milk, and fortified fruit juice.  8 oz (237 ml) of kefir, yogurt, and soy yogurt.  4 oz (118 ml) of tofu.  1 oz of cheese.  1 cup (300 g) of dried figs.  1 cup (91 g) of cooked broccoli.  1-3 oz can of sardines or mackerel.  Most people need 1000 to 1500 mg of calcium each day. Talk to your dietitian about how much calcium is recommended for you. Shopping   Buy plenty of fresh fruits and vegetables. Most people do not need to avoid fruits and vegetables, even if they contain nutrients that may contribute to kidney stones.  When shopping for convenience foods, choose:  Whole pieces of fruit.  Premade salads with dressing on the side.  Low-fat fruit and yogurt smoothies.  Avoid buying frozen meals or prepared deli foods.  Look for foods with live cultures, such as yogurt and kefir. Cooking   Do not add salt to food when cooking. Place a salt shaker on the table and allow each person to add his or her own salt to taste.  Use vegetable protein, such as beans, textured vegetable  protein (TVP), or tofu instead of meat in pasta, casseroles, and soups. Meal planning   Eat less salt, if told by your dietitian. To do this:  Avoid eating processed or premade food.  Avoid eating fast food.  Eat less animal protein, including cheese, meat, poultry, or fish, if told by your dietitian. To do this:  Limit the number of times you have meat, poultry, fish, or cheese each week. Eat a diet free of meat at least 2 days a week.  Eat only one serving each day of meat, poultry, fish, or seafood.  When you prepare animal protein, cut pieces into small portion sizes. For most meat and fish, one serving is about the size of one deck of cards.  Eat at least 5 servings of fresh fruits and vegetables each day. To do this:  Keep fruits and vegetables on hand for snacks.  Eat 1 piece of fruit or a handful of berries with breakfast.  Have a salad and fruit at lunch.  Have two kinds of vegetables at dinner.  Limit foods that are high in a substance called oxalate. These include:  Spinach.  Rhubarb.  Beets.  Potato chips and french fries.  Nuts.  If you regularly take a diuretic medicine, make sure to eat at least 1-2 fruits or vegetables high in potassium each day. These include:  Avocado.  Banana.  Orange, prune, carrot, or tomato juice.  Baked potato.  Cabbage.    Beans and split peas. General instructions   Drink enough fluid to keep your urine clear or pale yellow. This is the most important thing you can do.  Talk to your health care provider and dietitian about taking daily supplements. Depending on your health and the cause of your kidney stones, you may be advised:  Not to take supplements with vitamin C.  To take a calcium supplement.  To take a daily probiotic supplement.  To take other supplements such as magnesium, fish oil, or vitamin B6.  Take all medicines and supplements as told by your health care provider.  Limit alcohol intake to no  more than 1 drink a day for nonpregnant women and 2 drinks a day for men. One drink equals 12 oz of beer, 5 oz of wine, or 1 oz of hard liquor.  Lose weight if told by your health care provider. Work with your dietitian to find strategies and an eating plan that works best for you. What foods are not recommended? Limit your intake of the following foods, or as told by your dietitian. Talk to your dietitian about specific foods you should avoid based on the type of kidney stones and your overall health. Grains  Breads. Bagels. Rolls. Baked goods. Salted crackers. Cereal. Pasta. Vegetables  Spinach. Rhubarb. Beets. Canned vegetables. Pickles. Olives. Meats and other protein foods  Nuts. Nut butters. Large portions of meat, poultry, or fish. Salted or cured meats. Deli meats. Hot dogs. Sausages. Dairy  Cheese. Beverages  Regular soft drinks. Regular vegetable juice. Seasonings and other foods  Seasoning blends with salt. Salad dressings. Canned soups. Soy sauce. Ketchup. Barbecue sauce. Canned pasta sauce. Casseroles. Pizza. Lasagna. Frozen meals. Potato chips. French fries. Summary  You can reduce your risk of kidney stones by making changes to your diet.  The most important thing you can do is drink enough fluid. You should drink enough fluid to keep your urine clear or pale yellow.  Ask your health care provider or dietitian how much protein from animal sources you should eat each day, and also how much salt and calcium you should have each day. This information is not intended to replace advice given to you by your health care provider. Make sure you discuss any questions you have with your health care provider. Document Released: 01/09/2011 Document Revised: 08/25/2016 Document Reviewed: 08/25/2016 Elsevier Interactive Patient Education  2017 [employer]  

## 2017-06-22 ENCOUNTER — Telehealth: Payer: Self-pay | Admitting: Internal Medicine

## 2017-06-22 NOTE — Telephone Encounter (Signed)
Reference # [id number] . Please call regarding Nifedipine.

## 2017-06-22 NOTE — Telephone Encounter (Signed)
S/w pharmacist from [facility] who needed clarification of which generic of nifedipine Dr End prescribes. Gave verbal order for procardia XL for prescription. She verbalized understanding and will fill Rx for patient.

## 2017-06-24 ENCOUNTER — Telehealth: Payer: Self-pay | Admitting: Urology

## 2017-06-24 NOTE — Telephone Encounter (Signed)
Patient canceled her cysto app with you on 07-09-17. She said that she wanted to hold off until after she talks to you about her kidney stone, she rescheduled her app til 09-03-17. She wants to discuss having litho first.   Tracey Mosley

## 2017-07-09 ENCOUNTER — Ambulatory Visit: Payer: Managed Care, Other (non HMO) | Admitting: Urology

## 2017-07-23 ENCOUNTER — Other Ambulatory Visit: Payer: Self-pay | Admitting: Internal Medicine

## 2017-07-23 DIAGNOSIS — Z1231 Encounter for screening mammogram for malignant neoplasm of breast: Secondary | ICD-10-CM

## 2017-08-04 ENCOUNTER — Encounter: Payer: Self-pay | Admitting: Internal Medicine

## 2017-08-04 ENCOUNTER — Ambulatory Visit: Payer: Managed Care, Other (non HMO) | Admitting: Internal Medicine

## 2017-08-04 VITALS — BP 120/80 | HR 72 | Ht 66.0 in | Wt 203.5 lb

## 2017-08-04 DIAGNOSIS — M25561 Pain in right knee: Secondary | ICD-10-CM | POA: Diagnosis not present

## 2017-08-04 DIAGNOSIS — I10 Essential (primary) hypertension: Secondary | ICD-10-CM

## 2017-08-04 DIAGNOSIS — Z23 Encounter for immunization: Secondary | ICD-10-CM | POA: Diagnosis not present

## 2017-08-04 NOTE — Progress Notes (Signed)
Follow-up Outpatient Visit Date: 08/04/2017  Primary Care Provider: Leotis ShamesSingh, Jasmine, MD [address] [facility] [place] [address]  Chief Complaint: Follow-up hypertension  HPI:  Tracey Mosley is a 66 y.o. year-old female with history of hypertension, hyperlipidemia, chronic kidney disease stage III, hypothyroidism, and obesity, who presents for follow-up of hypertension.  I last saw her in September, at which time she was doing well with the exception of continued leg swelling.  Pressure was better controlled at 130/86.  We agreed to decrease nifedipine to 30 mg daily to see if this would help improve her leg swelling.  Today, Tracey Mosley reports that she has been feeling quite well. Her only complaint is of intermittent throbbing behind the right knee when seated for long periods of time. If she elevates her leg or walks, the pain resolves. She has not had any trauma to the area. Calf edema has almost completely resolved with de-escalation of nifedipine. She notes that her home blood pressures have also been relatively well controlled with systolic readings around 130. She notes mild intermittent orthostatic lightheadedness without falls. She denies chest pain, shortness of breath, and palpitations. She is not exercising on a regular basis.  She followed up with Dr. Thedore MinsSingh (nephrology) last month. No medication changes were made. She is scheduled to follow up again with him in 6 months. She is considering lithotripsy before the [place] of the year with Dr. Apolinar JunesBrandon.  --------------------------------------------------------------------------------------------------  Cardiovascular History & Procedures: Cardiovascular Problems:  Uncontrolled hypertension  Risk Factors:  Hypertension, hyperlipidemia, and obesity  Cath/PCI:  None.  CV Surgery:  None.  EP Procedures and Devices:  None.  Non-Invasive Evaluation(s):  Pharmacologic MPI (06/08/17): Low risk study  without ischemia or scar. Normal LVEF.  TTE (05/18/17): Normal LV size and function. LVEF 55-60%. Normal RV size and function. No significant valvular abnormalities.  Recent CV Pertinent Labs: Lab Results  Component Value Date   K 4.2 05/06/2017   K 4.5 03/17/2014   BUN 22 (H) 05/06/2017   BUN 22 (H) 03/17/2014   CREATININE 1.02 (H) 05/06/2017   CREATININE 0.84 03/17/2014    Past medical and surgical history were reviewed and updated in EPIC.  Current Meds  Medication Sig  . escitalopram (LEXAPRO) 20 MG tablet TAKE 1 TABLET BY MOUTH ONCE DAILY  . fluticasone (FLONASE) 50 MCG/ACT nasal spray Place 1 spray into both nostrils daily.   . hydrochlorothiazide (HYDRODIURIL) 25 MG tablet Take 1 tablet (25 mg total) by mouth daily.  Marland Kitchen. levothyroxine (SYNTHROID, LEVOTHROID) 50 MCG tablet Take 50 mcg by mouth daily before breakfast.   . lisinopril (PRINIVIL,ZESTRIL) 40 MG tablet Take 40 mg by mouth daily.  . Multiple Vitamin (MULTI-VITAMINS) TABS Take by mouth.  Marland Kitchen. NIFEdipine (PROCARDIA-XL/ADALAT-CC/NIFEDICAL-XL) 30 MG 24 hr tablet Take 1 tablet (30 mg total) by mouth daily.  . simvastatin (ZOCOR) 20 MG tablet TAKE 1 TABLET BY MOUTH  NIGHTLY  . [DISCONTINUED] hydrALAZINE (APRESOLINE) 25 MG tablet Take 25 mg by mouth 3 (three) times daily.     Allergies: Codeine sulfate; Epinephrine; and Sulfa antibiotics  Social History   Socioeconomic History  . Marital status: Married    Spouse name: Not on file  . Number of children: Not on file  . Years of education: Not on file  . Highest education level: Not on file  Social Needs  . Financial resource strain: Not on file  . Food insecurity - worry: Not on file  . Food insecurity - inability: Not  on file  . Transportation needs - medical: Not on file  . Transportation needs - non-medical: Not on file  Occupational History  . Not on file  Tobacco Use  . Smoking status: Never Smoker  . Smokeless tobacco: Never Used  Substance and Sexual  Activity  . Alcohol use: No    Alcohol/week: 1.2 oz    Types: 2 Cans of beer per week  . Drug use: No  . Sexual activity: Not on file  Other Topics Concern  . Not on file  Social History Narrative  . Not on file    Family History  Problem Relation Age of Onset  . Cancer Brother        Abdomina  . Heart disease Mother        leaky valves  . Heart attack Father [phone]  . Aneurysm Father   . Breast cancer Neg Hx     Review of Systems: A 12-system review of systems was performed and was negative except as noted in the HPI.  --------------------------------------------------------------------------------------------------  Physical Exam: BP 120/80 (BP Location: Left Arm, Patient Position: Sitting, Cuff Size: Normal)   Pulse 72   Ht 5\' 6"  (1.676 m)   Wt 203 lb 8 oz (92.3 kg)   BMI 32.85 kg/m   General:  Obese woman, seated comfortably in the exam room. HEENT: No conjunctival pallor or scleral icterus. Moist mucous membranes.  OP clear. Neck: Supple without lymphadenopathy, thyromegaly, JVD, or HJR. No carotid bruit. Lungs: Normal work of breathing. Clear to auscultation bilaterally without wheezes or crackles. Heart: Regular rate and rhythm without murmurs, rubs, or gallops. Unable to assess PMI due to body habitus. Abd: Bowel sounds present. Soft, NT/ND without hepatosplenomegaly Ext: No lower extremity edema. Radial, PT, and DP pulses are 2+ bilaterally. Right knee without deformity or focal tenderness. No obvious knee joint effusion. Skin: Warm and dry without rash.  EKG:  Normal sinus rhythm with inferior Q waves and poor R-wave progression. No significant change from prior tracing.  Lab Results  Component Value Date   WBC 9.1 03/17/2014   HGB 14.1 03/17/2014   HCT 42.0 03/17/2014   MCV 94 03/17/2014   PLT 206 03/17/2014    Lab Results  Component Value Date   NA 141 05/06/2017   K 4.2 05/06/2017   CL 106 05/06/2017   CO2 25 05/06/2017   BUN 22 (H) 05/06/2017    CREATININE 1.02 (H) 05/06/2017   GLUCOSE 100 (H) 05/06/2017   ALT 25 05/06/2017    No results found for: CHOL, HDL, LDLCALC, LDLDIRECT, TRIG, CHOLHDL  --------------------------------------------------------------------------------------------------  ASSESSMENT AND PLAN: Hypertension Blood pressure is better controlled today, the diastolic remains borderline elevated. Tracey Mosley is tolerating her current regimen quite well. An effort to simplify her medications, we will discontinue hydralazine. I have asked her to continue monitoring her blood pressure at home and let us know if it is consistently above 140/90. I advised her to continue with a low salt diet and to exercise.  Influenza immunization Seasonal flu vaccine administered today at the patient's request.  Right knee pain Uncertain etiology. I have advised Ms. Asselin to follow-up with her PCP.  Follow-up: Return to clinic in 6 months.  Yvonne Kendallhristopher Shere Eisenhart, MD 08/04/2017 9:02 PM

## 2017-08-04 NOTE — Patient Instructions (Signed)
Medication Instructions:  Your physician has recommended you make the following change in your medication:  1- STOP taking Hydralazine.   Labwork: none   Testing/Procedures: none  Follow-Up: Your physician wants you to follow-up in: 6 MONTHS WITH DR END. You will receive a reminder letter in the mail two months in advance. If you don't receive a letter, please call our office to schedule the follow-up appointment.   Dr. Okey DupreEnd would like you to monitor your blood pressure a couple times a week for the next week or so. Call us if blood pressure is consistently greater than 140/90.   If you need a refill on your cardiac medications before your next appointment, please call your pharmacy.

## 2017-08-18 ENCOUNTER — Ambulatory Visit (INDEPENDENT_AMBULATORY_CARE_PROVIDER_SITE_OTHER): Payer: Managed Care, Other (non HMO) | Admitting: Urology

## 2017-08-18 ENCOUNTER — Other Ambulatory Visit: Payer: Self-pay | Admitting: Radiology

## 2017-08-18 ENCOUNTER — Encounter: Payer: Self-pay | Admitting: Urology

## 2017-08-18 VITALS — BP 145/76 | HR 80 | Ht 66.0 in | Wt 204.0 lb

## 2017-08-18 DIAGNOSIS — R3129 Other microscopic hematuria: Secondary | ICD-10-CM | POA: Diagnosis not present

## 2017-08-18 DIAGNOSIS — N200 Calculus of kidney: Secondary | ICD-10-CM

## 2017-08-18 NOTE — Progress Notes (Signed)
08/18/2017 11:59 AM   Kerrie BuffaloJanet B Canion 01/13/1951 [id number]  Referring provider: Leotis ShamesSingh, Jasmine, MD [address] [facility] [place], [place] [address]  Chief Complaint  Patient presents with  . Nephrolithiasis    discuss stones    HPI: 66 yo WF with a left renal stone with microscopic hematuria who was recommended to undergo cystoscopy but would like the stone addressed.  66 yo F with history of nephrolithiasis referred for evaluation of kidney stone.  She was referred to nephrology (Dr. Thedore MinsSingh) for her further evaluation of proteinuria and microscopic.  As part of the work up, she underwent CT abdomen pelvis w/o contrast on 05/2017 which showed 6 mm LLP nonobstructive stone.   There was also incidental bladder wall thickening.    She does have occasional left flank pain which is mild.  No gross hematuria.    She is s/p ESWL x 2 (~2013 and 2015) in the past for stones previously followed by Dr. Evelene CroonWolff.  She does no know what type of stone she makes.    Never smoker.   No previous hematuria work up of cystoscopy.   At her office visit on 06/18/2017 with Dr. Apolinar JunesBrandon, it was advised that the patient undergo a cystoscopy.  She was agreeable to this, but then she "Googled" it and became very frightened to have the procedure.   She would like to have her stone addressed and see if this will resolve the microscopic hematuria.    PMH: Past Medical History:  Diagnosis Date  . Hyperlipidemia   . Hypertension   . Kidney stones   . Osteoporosis   . Thyroid disease     Surgical History: Past Surgical History:  Procedure Laterality Date  . CHOLECYSTECTOMY    . LITHOTRIPSY      Home Medications:  Allergies as of 08/18/2017      Reactions   Codeine Sulfate Nausea Only   Epinephrine Other (See Comments)   Tremors   Sulfa Antibiotics Nausea And Vomiting      Medication List        Accurate as of 08/18/17 11:59 AM. Always use your most recent med list.            escitalopram 20 MG tablet Commonly known as:  LEXAPRO TAKE 1 TABLET BY MOUTH ONCE DAILY   fluticasone 50 MCG/ACT nasal spray Commonly known as:  FLONASE Place 1 spray into both nostrils daily.   hydrochlorothiazide 25 MG tablet Commonly known as:  HYDRODIURIL Take 1 tablet (25 mg total) by mouth daily.   levothyroxine 50 MCG tablet Commonly known as:  SYNTHROID, LEVOTHROID Take 50 mcg by mouth daily before breakfast.   lisinopril 40 MG tablet Commonly known as:  PRINIVIL,ZESTRIL Take 40 mg by mouth daily.   MULTI-VITAMINS Tabs Take by mouth.   NIFEdipine 30 MG 24 hr tablet Commonly known as:  PROCARDIA-XL/ADALAT-CC/NIFEDICAL-XL Take 1 tablet (30 mg total) by mouth daily.   simvastatin 20 MG tablet Commonly known as:  ZOCOR TAKE 1 TABLET BY MOUTH  NIGHTLY       Allergies:  Allergies  Allergen Reactions  . Codeine Sulfate Nausea Only  . Epinephrine Other (See Comments)    Tremors  . Sulfa Antibiotics Nausea And Vomiting    Family History: Family History  Problem Relation Age of Onset  . Cancer Brother        Abdomina  . Heart disease Mother        leaky valves  . Heart attack  Father [id number]  . Aneurysm Father   . Breast cancer Neg Hx     Social History:  reports that  has never smoked. she has never used smokeless tobacco. She reports that she does not drink alcohol or use drugs.  ROS: UROLOGY Frequent Urination?: Yes Hard to postpone urination?: No Burning/pain with urination?: No Get up at night to urinate?: No Leakage of urine?: No Urine stream starts and stops?: No Trouble starting stream?: No Do you have to strain to urinate?: No Blood in urine?: Yes Urinary tract infection?: No Sexually transmitted disease?: No Injury to kidneys or bladder?: No Painful intercourse?: No Weak stream?: No Currently pregnant?: No Vaginal bleeding?: No Last menstrual period?: n  Gastrointestinal Nausea?: No Vomiting?: No Indigestion/heartburn?:  No Diarrhea?: No Constipation?: No  Constitutional Fever: No Night sweats?: No Weight loss?: No Fatigue?: No  Skin Skin rash/lesions?: No Itching?: No  Eyes Blurred vision?: No Double vision?: No  Ears/Nose/Throat Sore throat?: No Sinus problems?: No  Hematologic/Lymphatic Swollen glands?: No Easy bruising?: No  Cardiovascular Leg swelling?: No Chest pain?: No  Respiratory Cough?: No Shortness of breath?: No  Endocrine Excessive thirst?: No  Musculoskeletal Back pain?: Yes Joint pain?: No  Neurological Headaches?: No Dizziness?: No  Psychologic Depression?: No Anxiety?: No  Physical Exam: BP (!) 145/76   Pulse 80   Ht 5\' 6"  (1.676 m)   Wt 204 lb (92.5 kg)   BMI 32.93 kg/m   Constitutional:  Alert and oriented, No acute distress. HEENT: [place] AT, moist mucus membranes.  Trachea midline, no masses. Cardiovascular: No clubbing, cyanosis, or edema. Respiratory: Normal respiratory effort, no increased work of breathing. GI: Abdomen is soft, nontender, nondistended, no abdominal masses GU: No CVA tenderness.  Skin: No rashes, bruises or suspicious lesions.. Neurologic: Grossly intact, no focal deficits, moving all 4 extremities. Psychiatric: Normal mood and affect.  Laboratory Data: Lab Results  Component Value Date   CREATININE 1.02 (H) 05/06/2017    Urinalysis Results for orders placed or performed during the hospital encounter of 06/18/17  Urinalysis, Complete w Microscopic  Result Value Ref Range   Color, Urine YELLOW YELLOW   APPearance HAZY (A) CLEAR   Specific Gravity, Urine 1.025 1.005 - 1.030   pH 6.5 5.0 - 8.0   Glucose, UA NEGATIVE NEGATIVE mg/dL   Hgb urine dipstick LARGE (A) NEGATIVE   Bilirubin Urine NEGATIVE NEGATIVE   Ketones, ur NEGATIVE NEGATIVE mg/dL   Protein, ur 960100 (A) NEGATIVE mg/dL   Nitrite NEGATIVE NEGATIVE   Leukocytes, UA NEGATIVE NEGATIVE   Squamous Epithelial / LPF 0-5 (A) NONE SEEN   WBC, UA 0-5 0 - 5  WBC/hpf   RBC / HPF 6-30 0 - 5 RBC/hpf   Bacteria, UA FEW (A) NONE SEEN   Granular Casts, UA PRESENT     Pertinent Imaging: CLINICAL DATA:  Microscopic hematuria  EXAM: CT ABDOMEN AND PELVIS WITHOUT CONTRAST  TECHNIQUE: Multidetector CT imaging of the abdomen and pelvis was performed following the standard protocol without oral or intravenous contrast material administration.  COMPARISON:  March 17, 2014  FINDINGS: Lower chest: There is a stable 5 mm nodular opacity in the lateral segment of the right lower lobe inferiorly. Lung bases otherwise are clear. There is a small hiatal hernia.  Hepatobiliary: No focal liver lesions are evident. Gallbladder wall is not appreciably thickened. Common bile duct is within normal limits given post cholecystectomy state. No biliary duct mass or calculus is appreciable on this study.  Pancreas: There is  no pancreatic mass or inflammatory focus.  Spleen: No splenic lesions are evident.  Adrenals/Urinary Tract: Adrenals appear normal bilaterally. There is no appreciable renal mass or hydronephrosis on either side. There is a 6 x 3 mm nonobstructing calculus in the lower pole of the left kidney. There is no ureteral calculus on either side. Urinary bladder is midline. There is thickening of the urinary bladder wall.  Stomach/Bowel: There is slight sigmoid diverticulosis without diverticulitis. There is no appreciable bowel wall or mesenteric thickening. There is no evident bowel obstruction. No free air or portal venous air.  Vascular/Lymphatic: There is atherosclerotic calcification in the aorta and right common iliac artery. There is no demonstrable aneurysm. Major mesenteric vessels appear patent on this noncontrast enhanced study. There is no appreciable adenopathy in the abdomen or pelvis.  Reproductive: Uterus is anteverted. There is no evident pelvic mass.  Other: Appendix appears normal. There is no abscess or  ascites in the abdomen or pelvis. There is thinning of the rectus muscle in the midline. A loop of colon extends into this area without bowel compromise.  Musculoskeletal: There is thoracolumbar levoscoliosis. There is slight anterolisthesis of L5 on S1 There is degenerative change in the lumbar spine. There are no blastic or lytic bone lesions. There is no intramuscular or abdominal wall lesions.  IMPRESSION: 1. 6 x 3 mm calculus lower pole left kidney without hydronephrosis. There is no hydronephrosis or ureteral calculus on either side.  2. Urinary bladder wall thickening, a finding felt to be indicative of cystitis.  3. Slight sigmoid diverticulosis. No diverticulitis. No bowel wall obstruction. Appendix appears normal. No abscess.  4. Small hiatal hernia. Thinning of the rectus muscle in the midline. Loop of colon extends into this area without bowel compromise.  5.  Aortoiliac atherosclerosis.  6. Scoliosis. Mild spondylolisthesis at L5-S1 with degenerative change in the lumbar spine.  7.  Gallbladder absent.  Aortic Atherosclerosis (ICD10-I70.0).   Electronically Signed   By: Bretta Bang III M.D.   On: 06/07/2017 10:58  CT abd/ pelvis reviewed personally today and with the patient  Assessment & Plan:    1. Nephrolithiasis 6 mm LLP stone, possibly symptomatic at times We discussed various treatment options including ESWL vs. ureteroscopy, laser lithotripsy, and stent vs. Continued observation. We discussed the risks and benefits of both including bleeding, infection, damage to surrounding structures, efficacy with need for possible further intervention, and need for temporary ureteral stent.  We discussed general stone prevention techniques including drinking plenty water with goal of producing 2.5 L urine daily, increased citric acid intake, avoidance of high oxalate containing foods, and decreased salt intake.  Information about dietary  recommendations given today.   She would like to pursue ESWL at this time as she is familiar with the procedure  2. Microscopic hematuria Likely related to stone or underlying renal disease, however, strongly recommend cystoscopy  No delayed ureteral imaging, but given non smoking history, low risk for up upper tract TCC Agreeable to cystoscopy/ cytology if hematuria persists after definitive treatment for the stone   Return for left ESWL.  Michiel Cowboy, PA-C  [facility] [address], [address] [place], [place] [address] [phone]

## 2017-08-18 NOTE — H&P (View-Only) (Signed)
08/18/2017 11:59 AM   Tracey BuffaloJanet B Mosley 01/13/1951 [id number]  Referring provider: Leotis ShamesSingh, Jasmine, MD [address] [facility] [place], [place] [id number]  Chief Complaint  Patient presents with  . Nephrolithiasis    discuss stones    HPI: 66 yo WF with a left renal stone with microscopic hematuria who was recommended to undergo cystoscopy but would like the stone addressed.  66 yo F with history of nephrolithiasis referred for evaluation of kidney stone.  She was referred to nephrology (Dr. Thedore MinsSingh) for her further evaluation of proteinuria and microscopic.  As part of the work up, she underwent CT abdomen pelvis w/o contrast on 05/2017 which showed 6 mm LLP nonobstructive stone.   There was also incidental bladder wall thickening.    She does have occasional left flank pain which is mild.  No gross hematuria.    She is s/p ESWL x 2 (~2013 and 2015) in the past for stones previously followed by Dr. Evelene CroonWolff.  She does no know what type of stone she makes.    Never smoker.   No previous hematuria work up of cystoscopy.   At her office visit on 06/18/2017 with Dr. Apolinar JunesBrandon, it was advised that the patient undergo a cystoscopy.  She was agreeable to this, but then she "Googled" it and became very frightened to have the procedure.   She would like to have her stone addressed and see if this will resolve the microscopic hematuria.    PMH: Past Medical History:  Diagnosis Date  . Hyperlipidemia   . Hypertension   . Kidney stones   . Osteoporosis   . Thyroid disease     Surgical History: Past Surgical History:  Procedure Laterality Date  . CHOLECYSTECTOMY    . LITHOTRIPSY      Home Medications:  Allergies as of 08/18/2017      Reactions   Codeine Sulfate Nausea Only   Epinephrine Other (See Comments)   Tremors   Sulfa Antibiotics Nausea And Vomiting      Medication List        Accurate as of 08/18/17 11:59 AM. Always use your most recent med list.            escitalopram 20 MG tablet Commonly known as:  LEXAPRO TAKE 1 TABLET BY MOUTH ONCE DAILY   fluticasone 50 MCG/ACT nasal spray Commonly known as:  FLONASE Place 1 spray into both nostrils daily.   hydrochlorothiazide 25 MG tablet Commonly known as:  HYDRODIURIL Take 1 tablet (25 mg total) by mouth daily.   levothyroxine 50 MCG tablet Commonly known as:  SYNTHROID, LEVOTHROID Take 50 mcg by mouth daily before breakfast.   lisinopril 40 MG tablet Commonly known as:  PRINIVIL,ZESTRIL Take 40 mg by mouth daily.   MULTI-VITAMINS Tabs Take by mouth.   NIFEdipine 30 MG 24 hr tablet Commonly known as:  PROCARDIA-XL/ADALAT-CC/NIFEDICAL-XL Take 1 tablet (30 mg total) by mouth daily.   simvastatin 20 MG tablet Commonly known as:  ZOCOR TAKE 1 TABLET BY MOUTH  NIGHTLY       Allergies:  Allergies  Allergen Reactions  . Codeine Sulfate Nausea Only  . Epinephrine Other (See Comments)    Tremors  . Sulfa Antibiotics Nausea And Vomiting    Family History: Family History  Problem Relation Age of Onset  . Cancer Brother        Abdomina  . Heart disease Mother        leaky valves  . Heart attack  Father [id number]  . Aneurysm Father   . Breast cancer Neg Hx     Social History:  reports that  has never smoked. she has never used smokeless tobacco. She reports that she does not drink alcohol or use drugs.  ROS: UROLOGY Frequent Urination?: Yes Hard to postpone urination?: No Burning/pain with urination?: No Get up at night to urinate?: No Leakage of urine?: No Urine stream starts and stops?: No Trouble starting stream?: No Do you have to strain to urinate?: No Blood in urine?: Yes Urinary tract infection?: No Sexually transmitted disease?: No Injury to kidneys or bladder?: No Painful intercourse?: No Weak stream?: No Currently pregnant?: No Vaginal bleeding?: No Last menstrual period?: n  Gastrointestinal Nausea?: No Vomiting?: No Indigestion/heartburn?:  No Diarrhea?: No Constipation?: No  Constitutional Fever: No Night sweats?: No Weight loss?: No Fatigue?: No  Skin Skin rash/lesions?: No Itching?: No  Eyes Blurred vision?: No Double vision?: No  Ears/Nose/Throat Sore throat?: No Sinus problems?: No  Hematologic/Lymphatic Swollen glands?: No Easy bruising?: No  Cardiovascular Leg swelling?: No Chest pain?: No  Respiratory Cough?: No Shortness of breath?: No  Endocrine Excessive thirst?: No  Musculoskeletal Back pain?: Yes Joint pain?: No  Neurological Headaches?: No Dizziness?: No  Psychologic Depression?: No Anxiety?: No  Physical Exam: BP (!) 145/76   Pulse 80   Ht 5\' 6"  (1.676 m)   Wt 204 lb (92.5 kg)   BMI 32.93 kg/m   Constitutional:  Alert and oriented, No acute distress. HEENT: [place] AT, moist mucus membranes.  Trachea midline, no masses. Cardiovascular: No clubbing, cyanosis, or edema. Respiratory: Normal respiratory effort, no increased work of breathing. GI: Abdomen is soft, nontender, nondistended, no abdominal masses GU: No CVA tenderness.  Skin: No rashes, bruises or suspicious lesions.. Neurologic: Grossly intact, no focal deficits, moving all 4 extremities. Psychiatric: Normal mood and affect.  Laboratory Data: Lab Results  Component Value Date   CREATININE 1.02 (H) 05/06/2017    Urinalysis Results for orders placed or performed during the hospital encounter of 06/18/17  Urinalysis, Complete w Microscopic  Result Value Ref Range   Color, Urine YELLOW YELLOW   APPearance HAZY (A) CLEAR   Specific Gravity, Urine 1.025 1.005 - 1.030   pH 6.5 5.0 - 8.0   Glucose, UA NEGATIVE NEGATIVE mg/dL   Hgb urine dipstick LARGE (A) NEGATIVE   Bilirubin Urine NEGATIVE NEGATIVE   Ketones, ur NEGATIVE NEGATIVE mg/dL   Protein, ur 960100 (A) NEGATIVE mg/dL   Nitrite NEGATIVE NEGATIVE   Leukocytes, UA NEGATIVE NEGATIVE   Squamous Epithelial / LPF 0-5 (A) NONE SEEN   WBC, UA 0-5 0 - 5  WBC/hpf   RBC / HPF 6-30 0 - 5 RBC/hpf   Bacteria, UA FEW (A) NONE SEEN   Granular Casts, UA PRESENT     Pertinent Imaging: CLINICAL DATA:  Microscopic hematuria  EXAM: CT ABDOMEN AND PELVIS WITHOUT CONTRAST  TECHNIQUE: Multidetector CT imaging of the abdomen and pelvis was performed following the standard protocol without oral or intravenous contrast material administration.  COMPARISON:  March 17, 2014  FINDINGS: Lower chest: There is a stable 5 mm nodular opacity in the lateral segment of the right lower lobe inferiorly. Lung bases otherwise are clear. There is a small hiatal hernia.  Hepatobiliary: No focal liver lesions are evident. Gallbladder wall is not appreciably thickened. Common bile duct is within normal limits given post cholecystectomy state. No biliary duct mass or calculus is appreciable on this study.  Pancreas: There is  no pancreatic mass or inflammatory focus.  Spleen: No splenic lesions are evident.  Adrenals/Urinary Tract: Adrenals appear normal bilaterally. There is no appreciable renal mass or hydronephrosis on either side. There is a 6 x 3 mm nonobstructing calculus in the lower pole of the left kidney. There is no ureteral calculus on either side. Urinary bladder is midline. There is thickening of the urinary bladder wall.  Stomach/Bowel: There is slight sigmoid diverticulosis without diverticulitis. There is no appreciable bowel wall or mesenteric thickening. There is no evident bowel obstruction. No free air or portal venous air.  Vascular/Lymphatic: There is atherosclerotic calcification in the aorta and right common iliac artery. There is no demonstrable aneurysm. Major mesenteric vessels appear patent on this noncontrast enhanced study. There is no appreciable adenopathy in the abdomen or pelvis.  Reproductive: Uterus is anteverted. There is no evident pelvic mass.  Other: Appendix appears normal. There is no abscess or  ascites in the abdomen or pelvis. There is thinning of the rectus muscle in the midline. A loop of colon extends into this area without bowel compromise.  Musculoskeletal: There is thoracolumbar levoscoliosis. There is slight anterolisthesis of L5 on S1 There is degenerative change in the lumbar spine. There are no blastic or lytic bone lesions. There is no intramuscular or abdominal wall lesions.  IMPRESSION: 1. 6 x 3 mm calculus lower pole left kidney without hydronephrosis. There is no hydronephrosis or ureteral calculus on either side.  2. Urinary bladder wall thickening, a finding felt to be indicative of cystitis.  3. Slight sigmoid diverticulosis. No diverticulitis. No bowel wall obstruction. Appendix appears normal. No abscess.  4. Small hiatal hernia. Thinning of the rectus muscle in the midline. Loop of colon extends into this area without bowel compromise.  5.  Aortoiliac atherosclerosis.  6. Scoliosis. Mild spondylolisthesis at L5-S1 with degenerative change in the lumbar spine.  7.  Gallbladder absent.  Aortic Atherosclerosis (ICD10-I70.0).   Electronically Signed   By: Bretta Bang III M.D.   On: 06/07/2017 10:58  CT abd/ pelvis reviewed personally today and with the patient  Assessment & Plan:    1. Nephrolithiasis 6 mm LLP stone, possibly symptomatic at times We discussed various treatment options including ESWL vs. ureteroscopy, laser lithotripsy, and stent vs. Continued observation. We discussed the risks and benefits of both including bleeding, infection, damage to surrounding structures, efficacy with need for possible further intervention, and need for temporary ureteral stent.  We discussed general stone prevention techniques including drinking plenty water with goal of producing 2.5 L urine daily, increased citric acid intake, avoidance of high oxalate containing foods, and decreased salt intake.  Information about dietary  recommendations given today.   She would like to pursue ESWL at this time as she is familiar with the procedure  2. Microscopic hematuria Likely related to stone or underlying renal disease, however, strongly recommend cystoscopy  No delayed ureteral imaging, but given non smoking history, low risk for up upper tract TCC Agreeable to cystoscopy/ cytology if hematuria persists after definitive treatment for the stone   Return for left ESWL.  Michiel Cowboy, PA-C  [facility] [address], [address] [place], [place] [address] [phone]

## 2017-08-27 ENCOUNTER — Ambulatory Visit
Admission: RE | Admit: 2017-08-27 | Discharge: 2017-08-27 | Disposition: A | Discharge status: Home / Self Care | Payer: Managed Care, Other (non HMO) | Source: Ambulatory Visit | Attending: Internal Medicine | Admitting: Internal Medicine

## 2017-08-27 DIAGNOSIS — Z1231 Encounter for screening mammogram for malignant neoplasm of breast: Secondary | ICD-10-CM | POA: Diagnosis present

## 2017-09-03 ENCOUNTER — Ambulatory Visit: Payer: Managed Care, Other (non HMO) | Admitting: Urology

## 2017-09-08 MED ORDER — CIPROFLOXACIN HCL 500 MG PO TABS
500.0000 mg | ORAL_TABLET | ORAL | Status: AC
  Administered 2017-09-09: 500 mg via ORAL

## 2017-09-09 ENCOUNTER — Encounter
Admission: RE | Disposition: A | Discharge status: Home / Self Care | Payer: Self-pay | Source: Ambulatory Visit | Attending: Urology

## 2017-09-09 ENCOUNTER — Ambulatory Visit: Payer: Managed Care, Other (non HMO)

## 2017-09-09 ENCOUNTER — Ambulatory Visit
Admission: RE | Admit: 2017-09-09 | Discharge: 2017-09-09 | Disposition: A | Discharge status: Home / Self Care | Payer: Managed Care, Other (non HMO) | Source: Ambulatory Visit | Attending: Urology | Admitting: Urology

## 2017-09-09 ENCOUNTER — Other Ambulatory Visit: Payer: Self-pay

## 2017-09-09 DIAGNOSIS — Z79899 Other long term (current) drug therapy: Secondary | ICD-10-CM | POA: Insufficient documentation

## 2017-09-09 DIAGNOSIS — M4317 Spondylolisthesis, lumbosacral region: Secondary | ICD-10-CM | POA: Insufficient documentation

## 2017-09-09 DIAGNOSIS — Z882 Allergy status to sulfonamides status: Secondary | ICD-10-CM | POA: Diagnosis not present

## 2017-09-09 DIAGNOSIS — K449 Diaphragmatic hernia without obstruction or gangrene: Secondary | ICD-10-CM | POA: Insufficient documentation

## 2017-09-09 DIAGNOSIS — N200 Calculus of kidney: Secondary | ICD-10-CM | POA: Diagnosis not present

## 2017-09-09 DIAGNOSIS — I708 Atherosclerosis of other arteries: Secondary | ICD-10-CM | POA: Insufficient documentation

## 2017-09-09 DIAGNOSIS — Z885 Allergy status to narcotic agent status: Secondary | ICD-10-CM | POA: Diagnosis not present

## 2017-09-09 DIAGNOSIS — K5730 Diverticulosis of large intestine without perforation or abscess without bleeding: Secondary | ICD-10-CM | POA: Insufficient documentation

## 2017-09-09 DIAGNOSIS — M810 Age-related osteoporosis without current pathological fracture: Secondary | ICD-10-CM | POA: Diagnosis not present

## 2017-09-09 DIAGNOSIS — M419 Scoliosis, unspecified: Secondary | ICD-10-CM | POA: Insufficient documentation

## 2017-09-09 DIAGNOSIS — I700 Atherosclerosis of aorta: Secondary | ICD-10-CM | POA: Insufficient documentation

## 2017-09-09 DIAGNOSIS — Z888 Allergy status to other drugs, medicaments and biological substances status: Secondary | ICD-10-CM | POA: Insufficient documentation

## 2017-09-09 DIAGNOSIS — I10 Essential (primary) hypertension: Secondary | ICD-10-CM | POA: Diagnosis not present

## 2017-09-09 DIAGNOSIS — E785 Hyperlipidemia, unspecified: Secondary | ICD-10-CM | POA: Insufficient documentation

## 2017-09-09 HISTORY — PX: EXTRACORPOREAL SHOCK WAVE LITHOTRIPSY: SHX1557

## 2017-09-09 SURGERY — LITHOTRIPSY, ESWL
Anesthesia: Moderate Sedation | Laterality: Left

## 2017-09-09 MED ORDER — SODIUM CHLORIDE 0.9 % IV SOLN
INTRAVENOUS | Status: DC
  Administered 2017-09-09: 06:00:00 via INTRAVENOUS

## 2017-09-09 MED ORDER — ONDANSETRON HCL 4 MG/2ML IJ SOLN
INTRAMUSCULAR | Status: AC
  Administered 2017-09-09: 4 mg via INTRAVENOUS
  Filled 2017-09-09: qty 2

## 2017-09-09 MED ORDER — DIAZEPAM 5 MG PO TABS
ORAL_TABLET | ORAL | Status: AC
  Administered 2017-09-09: 10 mg via ORAL
  Filled 2017-09-09: qty 2

## 2017-09-09 MED ORDER — DIPHENHYDRAMINE HCL 25 MG PO CAPS
ORAL_CAPSULE | ORAL | Status: AC
  Administered 2017-09-09: 25 mg via ORAL
  Filled 2017-09-09: qty 1

## 2017-09-09 MED ORDER — ONDANSETRON HCL 4 MG/2ML IJ SOLN
4.0000 mg | Freq: Once | INTRAMUSCULAR | Status: AC
  Administered 2017-09-09: 4 mg via INTRAVENOUS

## 2017-09-09 MED ORDER — DIAZEPAM 5 MG PO TABS
10.0000 mg | ORAL_TABLET | ORAL | Status: AC
  Administered 2017-09-09: 10 mg via ORAL

## 2017-09-09 MED ORDER — DIPHENHYDRAMINE HCL 25 MG PO CAPS
25.0000 mg | ORAL_CAPSULE | ORAL | Status: AC
  Administered 2017-09-09: 25 mg via ORAL

## 2017-09-09 MED ORDER — CIPROFLOXACIN HCL 500 MG PO TABS
ORAL_TABLET | ORAL | Status: AC
  Administered 2017-09-09: 500 mg via ORAL
  Filled 2017-09-09: qty 1

## 2017-09-09 NOTE — Discharge Instructions (Signed)
°  Procedure not performed due to couldn't locate stone.  Call Dr. Virl DiamondStoiff if you have any questions/concerns.  You received sedative med preop.  No driving today.

## 2017-09-09 NOTE — Progress Notes (Signed)
Stone could not be treated due to overlying bowel gas-unable to localize with contrast.

## 2017-09-09 NOTE — Interval H&P Note (Signed)
History and Physical Interval Note:  09/09/2017 9:48 AM  Tracey BuffaloJanet B Mosley  has presented today for surgery, with the diagnosis of Kidney stone  The various methods of treatment have been discussed with the patient and family. After consideration of risks, benefits and other options for treatment, the patient has consented to  Procedure(s): EXTRACORPOREAL SHOCK WAVE LITHOTRIPSY (ESWL) (Left) as a surgical intervention .  The patient's history has been reviewed, patient examined, no change in status, stable for surgery.  I have reviewed the patient's chart and labs.  Questions were answered to the patient's satisfaction.     Nadine Ryle C Tirrell Buchberger

## 2017-09-09 NOTE — OR Nursing (Signed)
Pt advises she will call Dr. Lonna CobbStoioff if any questions/concerns

## 2017-09-09 NOTE — OR Nursing (Signed)
Procedure not performed, as report advises stone could not be found.

## 2017-09-10 ENCOUNTER — Other Ambulatory Visit: Payer: Self-pay | Admitting: Radiology

## 2017-09-10 ENCOUNTER — Telehealth: Payer: Self-pay | Admitting: Radiology

## 2017-09-10 ENCOUNTER — Encounter: Payer: Self-pay | Admitting: Urology

## 2017-09-10 DIAGNOSIS — N200 Calculus of kidney: Secondary | ICD-10-CM

## 2017-09-10 NOTE — Telephone Encounter (Signed)
Pt notified of procedure rescheduled to 09/16/17 with Vanna ScotlandAshley Brandon, MD. Advised pt to take 2 dulcolax tablets instead of the magnesium citrate. Pt voices understanding.

## 2017-09-10 NOTE — Telephone Encounter (Signed)
-----   Message from Riki AltesScott C Stoioff, MD sent at 09/09/2017  8:15 AM EST ----- Hi Ra Pfiester, I could not see her stone this am due to overlying bowel gas so could not do the treatment.  She will need to be r/s and wants to do this month due to insurance.  I would recc she take 2 dulcolax tabs instead of the mag citrate for the laxative

## 2017-09-14 MED ORDER — CIPROFLOXACIN IN D5W 400 MG/200ML IV SOLN: 400.0000 mg | INTRAVENOUS | Status: DC

## 2017-09-16 ENCOUNTER — Ambulatory Visit
Admission: RE | Admit: 2017-09-16 | Discharge: 2017-09-16 | Disposition: A | Discharge status: Home / Self Care | Payer: Managed Care, Other (non HMO) | Source: Ambulatory Visit | Attending: Urology | Admitting: Urology

## 2017-09-16 ENCOUNTER — Encounter
Admission: RE | Disposition: A | Discharge status: Home / Self Care | Payer: Self-pay | Source: Ambulatory Visit | Attending: Urology

## 2017-09-16 ENCOUNTER — Encounter: Payer: Self-pay | Admitting: *Deleted

## 2017-09-16 ENCOUNTER — Ambulatory Visit: Payer: Managed Care, Other (non HMO)

## 2017-09-16 ENCOUNTER — Other Ambulatory Visit: Payer: Self-pay

## 2017-09-16 DIAGNOSIS — N200 Calculus of kidney: Secondary | ICD-10-CM | POA: Diagnosis not present

## 2017-09-16 DIAGNOSIS — I10 Essential (primary) hypertension: Secondary | ICD-10-CM | POA: Insufficient documentation

## 2017-09-16 DIAGNOSIS — E669 Obesity, unspecified: Secondary | ICD-10-CM | POA: Diagnosis not present

## 2017-09-16 HISTORY — PX: EXTRACORPOREAL SHOCK WAVE LITHOTRIPSY: SHX1557

## 2017-09-16 SURGERY — LITHOTRIPSY, ESWL
Anesthesia: Moderate Sedation | Laterality: Left

## 2017-09-16 MED ORDER — CIPROFLOXACIN HCL 500 MG PO TABS
ORAL_TABLET | ORAL | Status: AC
  Administered 2017-09-16: 500 mg via ORAL
  Filled 2017-09-16: qty 1

## 2017-09-16 MED ORDER — DIAZEPAM 5 MG PO TABS
ORAL_TABLET | ORAL | Status: AC
  Administered 2017-09-16: 10 mg via ORAL
  Filled 2017-09-16: qty 2

## 2017-09-16 MED ORDER — HYDROCODONE-ACETAMINOPHEN 5-325 MG PO TABS: 1.0000 | ORAL_TABLET | Freq: Four times a day (QID) | ORAL | 0 refills | Status: DC | PRN

## 2017-09-16 MED ORDER — DIPHENHYDRAMINE HCL 25 MG PO CAPS
25.0000 mg | ORAL_CAPSULE | ORAL | Status: AC
Start: 2017-09-16 — End: 2017-09-16
  Administered 2017-09-16: 25 mg via ORAL

## 2017-09-16 MED ORDER — CIPROFLOXACIN HCL 500 MG PO TABS
500.0000 mg | ORAL_TABLET | Freq: Once | ORAL | Status: AC
  Administered 2017-09-16: 500 mg via ORAL

## 2017-09-16 MED ORDER — ONDANSETRON HCL 4 MG/2ML IJ SOLN
INTRAMUSCULAR | Status: AC
  Administered 2017-09-16: 4 mg via INTRAVENOUS
  Filled 2017-09-16: qty 2

## 2017-09-16 MED ORDER — DIPHENHYDRAMINE HCL 25 MG PO CAPS
ORAL_CAPSULE | ORAL | Status: AC
  Administered 2017-09-16: 25 mg via ORAL
  Filled 2017-09-16: qty 1

## 2017-09-16 MED ORDER — DIAZEPAM 5 MG PO TABS
10.0000 mg | ORAL_TABLET | ORAL | Status: AC
  Administered 2017-09-16: 10 mg via ORAL

## 2017-09-16 MED ORDER — ONDANSETRON HCL 4 MG/2ML IJ SOLN
4.0000 mg | Freq: Once | INTRAMUSCULAR | Status: AC
  Administered 2017-09-16: 4 mg via INTRAVENOUS

## 2017-09-16 MED ORDER — TAMSULOSIN HCL 0.4 MG PO CAPS: 0.4000 mg | ORAL_CAPSULE | Freq: Every day | ORAL | 0 refills | Status: DC

## 2017-09-16 MED ORDER — SODIUM CHLORIDE 0.9 % IV SOLN: INTRAVENOUS | Status: DC

## 2017-09-16 NOTE — Discharge Instructions (Signed)
AMBULATORY SURGERY  DISCHARGE INSTRUCTIONS   1) The drugs that you were given will stay in your system until tomorrow so for the next 24 hours you should not:  A) Drive an automobile B) Make any legal decisions C) Drink any alcoholic beverage   2) You may resume regular meals tomorrow.  Today it is better to start with liquids and gradually work up to solid foods.  You may eat anything you prefer, but it is better to start with liquids, then soup and crackers, and gradually work up to solid foods.   3) Please notify your doctor immediately if you have any unusual bleeding, trouble breathing, redness and pain at the surgery site, drainage, fever, or pain not relieved by medication.    4) Additional Instructions:   Please contact your physician with any problems or Same Day Surgery at [phone], Monday through Friday 6 am to 4 pm, or [facility] number at [phone].See [facility] discharge instructions in chart.  

## 2017-09-16 NOTE — Interval H&P Note (Signed)
History and Physical Interval Note:  09/16/2017 8:23 AM  Tracey Mosley  has presented today for surgery, with the diagnosis of Kidney stone  The various methods of treatment have been discussed with the patient and family. After consideration of risks, benefits and other options for treatment, the patient has consented to  Procedure(s): EXTRACORPOREAL SHOCK WAVE LITHOTRIPSY (ESWL) (Left) as a surgical intervention .  The patient's history has been reviewed, patient examined, no change in status, stable for surgery.  I have reviewed the patient's chart and labs.  Questions were answered to the patient's satisfaction.     Vanna ScotlandAshley Cherril Hett

## 2017-09-17 ENCOUNTER — Encounter: Payer: Self-pay | Admitting: Urology

## 2017-09-23 ENCOUNTER — Ambulatory Visit: Payer: Managed Care, Other (non HMO) | Admitting: Urology

## 2017-09-29 NOTE — Progress Notes (Signed)
09/30/2017 2:13 PM   Tracey Mosley May 03, 1951 [id number]  Referring provider: Leotis Shames, MD [address] [facility] [address] [facility] [place], [place] [address]  Chief Complaint  Patient presents with  . Nephrolithiasis    2wk post w/KUB    HPI: [name] who is s/p left ESWL who presents today for a two week follow up.  Background history [name] with a left renal stone with microscopic hematuria who was recommended to undergo cystoscopy but would like the stone addressed.  68 yo F with history of nephrolithiasis referred for evaluation of kidney stone.  She was referred to nephrology ([name]) for her further evaluation of proteinuria and microscopic.  As part of the work up, she underwent CT abdomen pelvis w/o contrast on 05/2017 which showed 6 mm LLP nonobstructive stone.   There was also incidental bladder wall thickening.  She does have occasional left flank pain which is mild.  No gross hematuria.  She is s/p ESWL x 2 (~2013 and 2015) in the past for stones previously followed by Dr. Evelene Croon.  She does no know what type of stone she makes.  Never smoker.  No previous hematuria work up of cystoscopy.   At her office visit on 06/18/2017 with Dr. Apolinar Junes, it was advised that the patient undergo a cystoscopy.  She was agreeable to this, but then she "Googled" it and became very frightened to have the procedure.   She would like to have her stone addressed and see if this will resolve the microscopic hematuria.    She underwent left ESWL on 09/16/2017 with Dr. Apolinar Junes.  After the procedure, she experienced some mild left flank pain.     Today, she is experiencing some mild left flank pain.  She is not having gross hematuria, dysuria or suprapubic pain.  She is not having fevers, chills, nausea or vomiting.    Her KUB taken today demonstrates calcifications projected over the upper and lower pole of the left kidney similar to previous study.    PMH: Past Medical History:  Diagnosis  Date  . Hyperlipidemia   . Hypertension   . Kidney stones   . Osteoporosis   . Thyroid disease     Surgical History: Past Surgical History:  Procedure Laterality Date  . CHOLECYSTECTOMY    . EXTRACORPOREAL SHOCK WAVE LITHOTRIPSY Left 09/09/2017   Procedure: EXTRACORPOREAL SHOCK WAVE LITHOTRIPSY (ESWL);  Surgeon: Riki Altes, MD;  Location: [facility] ORS;  Service: Urology;  Laterality: Left;  . EXTRACORPOREAL SHOCK WAVE LITHOTRIPSY Left 09/16/2017   Procedure: EXTRACORPOREAL SHOCK WAVE LITHOTRIPSY (ESWL);  Surgeon: Vanna Scotland, MD;  Location: [facility] ORS;  Service: Urology;  Laterality: Left;  . LITHOTRIPSY      Home Medications:  Allergies as of 09/30/2017      Reactions   Codeine Sulfate Nausea Only   Epinephrine Other (See Comments)   Tremors   Sulfa Antibiotics Nausea And Vomiting      Medication List        Accurate as of 09/30/17 11:59 PM. Always use your most recent med list.          escitalopram 20 MG tablet Commonly known as:  LEXAPRO TAKE 1 TABLET BY MOUTH ONCE DAILY   fluticasone 50 MCG/ACT nasal spray Commonly known as:  FLONASE Place 1 spray into both nostrils daily.   hydrochlorothiazide 25 MG tablet Commonly known as:  HYDRODIURIL Take 1 tablet (25 mg total) by mouth daily.   levothyroxine 50 MCG  tablet Commonly known as:  SYNTHROID, LEVOTHROID Take 50 mcg by mouth daily before breakfast.   lisinopril 40 MG tablet Commonly known as:  PRINIVIL,ZESTRIL Take 40 mg by mouth daily.   MULTI-VITAMINS Tabs Take by mouth.   NIFEdipine 30 MG 24 hr tablet Commonly known as:  PROCARDIA-XL/ADALAT-CC/NIFEDICAL-XL Take 1 tablet (30 mg total) by mouth daily.   simvastatin 20 MG tablet Commonly known as:  ZOCOR TAKE 1 TABLET BY MOUTH  NIGHTLY   tamsulosin 0.4 MG Caps capsule Commonly known as:  FLOMAX Take 1 capsule (0.4 mg total) by mouth daily.       Allergies:  Allergies  Allergen Reactions  . Codeine Sulfate Nausea Only  . Epinephrine  Other (See Comments)    Tremors  . Sulfa Antibiotics Nausea And Vomiting    Family History: Family History  Problem Relation Age of Onset  . Cancer Brother        Abdomina  . Heart disease Mother        leaky valves  . Heart attack Father [id number]  . Aneurysm Father   . Breast cancer Neg Hx     Social History:  reports that  has never smoked. she has never used smokeless tobacco. She reports that she drinks about 1.2 oz of alcohol per week. She reports that she does not use drugs.  ROS: UROLOGY Frequent Urination?: No Hard to postpone urination?: No Burning/pain with urination?: No Get up at night to urinate?: No Leakage of urine?: No Urine stream starts and stops?: No Trouble starting stream?: No Do you have to strain to urinate?: No Blood in urine?: No Urinary tract infection?: No Sexually transmitted disease?: No Injury to kidneys or bladder?: No Painful intercourse?: No Weak stream?: No Currently pregnant?: No Vaginal bleeding?: No Last menstrual period?: n  Gastrointestinal Nausea?: No Vomiting?: No Indigestion/heartburn?: No Diarrhea?: No Constipation?: No  Constitutional Fever: No Night sweats?: Yes Weight loss?: No Fatigue?: No  Skin Skin rash/lesions?: No Itching?: No  Eyes Blurred vision?: No Double vision?: No  Ears/Nose/Throat Sore throat?: No Sinus problems?: Yes  Hematologic/Lymphatic Swollen glands?: No Easy bruising?: No  Cardiovascular Leg swelling?: No Chest pain?: No  Respiratory Cough?: No Shortness of breath?: No  Endocrine Excessive thirst?: No  Musculoskeletal Back pain?: Yes Joint pain?: No  Neurological Headaches?: No Dizziness?: No  Psychologic Depression?: No Anxiety?: No  Physical Exam: BP 124/74   Pulse 91   Ht 5\' 6"  (1.676 m)   Wt 205 lb (93 kg)   BMI 33.09 kg/m   Constitutional:  Alert and oriented, No acute distress. HEENT: [place] AT, moist mucus membranes.  Trachea midline, no  masses. Cardiovascular: No clubbing, cyanosis, or edema. Respiratory: Normal respiratory effort, no increased work of breathing. GI: Abdomen is soft, nontender, nondistended, no abdominal masses GU: No CVA tenderness.  Skin: No rashes, bruises or suspicious lesions.. Neurologic: Grossly intact, no focal deficits, moving all 4 extremities. Psychiatric: Normal mood and affect.  Laboratory Data: Lab Results  Component Value Date   CREATININE 1.02 (H) 05/06/2017  Urinalysis Results for orders placed or performed during the hospital encounter of 06/18/17  Urinalysis, Complete w Microscopic  Result Value Ref Range   Color, Urine YELLOW YELLOW   APPearance HAZY (A) CLEAR   Specific Gravity, Urine 1.025 1.005 - 1.030   pH 6.5 5.0 - 8.0   Glucose, UA NEGATIVE NEGATIVE mg/dL   Hgb urine dipstick LARGE (A) NEGATIVE   Bilirubin Urine NEGATIVE NEGATIVE   Ketones, ur NEGATIVE NEGATIVE  mg/dL   Protein, ur 308 (A) NEGATIVE mg/dL   Nitrite NEGATIVE NEGATIVE   Leukocytes, UA NEGATIVE NEGATIVE   Squamous Epithelial / LPF 0-5 (A) NONE SEEN   WBC, UA 0-5 0 - 5 WBC/hpf   RBC / HPF 6-30 0 - 5 RBC/hpf   Bacteria, UA FEW (A) NONE SEEN   Granular Casts, UA PRESENT    I have reviewed the labs.  Pertinent Imaging: CLINICAL DATA:  History of kidney stones. Lithotripsy 2 weeks ago for left-sided stone. Stone has not passed yet. No pain reported.  EXAM: ABDOMEN - 1 VIEW  COMPARISON:  09/16/2017  FINDINGS: Small calcifications are projected over the upper and lower pole of the left kidney, likely representing renal stones. Position appearance is similar to previous study. Surgical clips in the right upper quadrant. No stones demonstrated in the right kidney, bladder region, or along the expected ureteral course. Gas and stool throughout the colon. No small or large bowel distention. Degenerative changes and scoliosis of the lumbar spine.  IMPRESSION: Calcifications projected over the upper  and lower pole of the left kidney similar to previous study.   Electronically Signed   By: Burman Nieves M.D.   On: 09/30/2017 19:00  I have independently reviewed the films.    Assessment & Plan:    1. Nephrolithiasis  - 6 mm LLP stone, possibly symptomatic at times, remains in the kidney  - patient will increase water intake and RTC in 2 weeks for KUB  - Advised to contact our office or seek treatment in the ED if becomes febrile or pain/ vomiting are difficult control in order to arrange for emergent/urgent intervention  2. Microscopic hematuria Likely related to stone or underlying renal disease, however, strongly recommend cystoscopy  No delayed ureteral imaging, but given non smoking history, low risk for up upper tract TCC Agreeable to cystoscopy/ cytology if hematuria persists after definitive treatment for the stone   Return in about 2 weeks (around 10/14/2017) for KUB and office.  Michiel Cowboy, PA-C  [facility] [address], [address] [place], [place] [address] [phone]

## 2017-09-30 ENCOUNTER — Ambulatory Visit (INDEPENDENT_AMBULATORY_CARE_PROVIDER_SITE_OTHER): Payer: Managed Care, Other (non HMO) | Admitting: Urology

## 2017-09-30 ENCOUNTER — Ambulatory Visit
Admission: RE | Admit: 2017-09-30 | Discharge: 2017-09-30 | Disposition: A | Discharge status: Home / Self Care | Payer: Managed Care, Other (non HMO) | Source: Ambulatory Visit | Attending: Urology | Admitting: Urology

## 2017-09-30 ENCOUNTER — Encounter: Payer: Self-pay | Admitting: Urology

## 2017-09-30 VITALS — BP 124/74 | HR 91 | Ht 66.0 in | Wt 205.0 lb

## 2017-09-30 DIAGNOSIS — Z87448 Personal history of other diseases of urinary system: Secondary | ICD-10-CM

## 2017-09-30 DIAGNOSIS — N200 Calculus of kidney: Secondary | ICD-10-CM | POA: Diagnosis not present

## 2017-09-30 DIAGNOSIS — R3129 Other microscopic hematuria: Secondary | ICD-10-CM

## 2017-10-13 NOTE — Progress Notes (Signed)
10/14/2017 3:30 PM   Tracey Mosley 1951-09-18 [id number]  Referring provider: Leotis Shames, MD [address] [facility] [place], [place] [address]  Chief Complaint  Patient presents with  . Results    HPI: [name] who is s/p left ESWL who presents today for a two week follow up.  Background history [name] with a left renal stone with microscopic hematuria who was recommended to undergo cystoscopy but would like the stone addressed.  67 yo F with history of nephrolithiasis referred for evaluation of kidney stone.  She was referred to nephrology ([name]) for her further evaluation of proteinuria and microscopic.  As part of the work up, she underwent CT abdomen pelvis w/o contrast on 05/2017 which showed 6 mm LLP nonobstructive stone.   There was also incidental bladder wall thickening.  She does have occasional left flank pain which is mild.  No gross hematuria.  She is s/p ESWL x 2 (~2013 and 2015) in the past for stones previously followed by Dr. Evelene Croon.  She does no know what type of stone she makes.  Never smoker.  No previous hematuria work up of cystoscopy.   At her office visit on 06/18/2017 with Dr. Apolinar Junes, it was advised that the patient undergo a cystoscopy.  She was agreeable to this, but then she "Googled" it and became very frightened to have the procedure.   She would like to have her stone addressed and see if this will resolve the microscopic hematuria.    She underwent left ESWL on 09/16/2017 with Dr. Apolinar Junes.  After the procedure, she experienced some mild left flank pain.     Her KUB taken on 09/30/2017 demonstrates calcifications projected over the upper and lower pole of the left kidney similar to previous study.    Her KUB taken on 10/14/2017 demonstrated kidneys partially obscured by overlying bowel gas is stool. At least 1 persistent calculus in lower pole of left kidney measuring approximately 5 mm.  She is having some mild twinges in her  left flank.  She is not having fevers, chills, nausea or vomiting.  She is not gross hematuria, dysuria or suprapubic pain.    PMH: Past Medical History:  Diagnosis Date  . Hyperlipidemia   . Hypertension   . Kidney stones   . Osteoporosis   . Thyroid disease     Surgical History: Past Surgical History:  Procedure Laterality Date  . CHOLECYSTECTOMY    . EXTRACORPOREAL SHOCK WAVE LITHOTRIPSY Left 09/09/2017   Procedure: EXTRACORPOREAL SHOCK WAVE LITHOTRIPSY (ESWL);  Surgeon: Riki Altes, MD;  Location: [facility] ORS;  Service: Urology;  Laterality: Left;  . EXTRACORPOREAL SHOCK WAVE LITHOTRIPSY Left 09/16/2017   Procedure: EXTRACORPOREAL SHOCK WAVE LITHOTRIPSY (ESWL);  Surgeon: Vanna Scotland, MD;  Location: [facility] ORS;  Service: Urology;  Laterality: Left;  . LITHOTRIPSY      Home Medications:  Allergies as of 10/14/2017      Reactions   Codeine Sulfate Nausea Only   Epinephrine Other (See Comments)   Tremors   Sulfa Antibiotics Nausea And Vomiting      Medication List        Accurate as of 10/14/17  3:30 PM. Always use your most recent med list.          escitalopram 20 MG tablet Commonly known as:  LEXAPRO TAKE 1 TABLET BY MOUTH ONCE DAILY   fluticasone 50 MCG/ACT nasal spray Commonly known as:  FLONASE Place 1 spray into both  nostrils daily.   hydrochlorothiazide 25 MG tablet Commonly known as:  HYDRODIURIL Take 1 tablet (25 mg total) by mouth daily.   levothyroxine 50 MCG tablet Commonly known as:  SYNTHROID, LEVOTHROID Take 50 mcg by mouth daily before breakfast.   lisinopril 40 MG tablet Commonly known as:  PRINIVIL,ZESTRIL Take 40 mg by mouth daily.   MULTI-VITAMINS Tabs Take by mouth.   NIFEdipine 30 MG 24 hr tablet Commonly known as:  PROCARDIA-XL/ADALAT-CC/NIFEDICAL-XL Take 1 tablet (30 mg total) by mouth daily.   simvastatin 20 MG tablet Commonly known as:  ZOCOR TAKE 1 TABLET BY MOUTH  NIGHTLY   tamsulosin 0.4 MG Caps capsule Commonly  known as:  FLOMAX Take 1 capsule (0.4 mg total) by mouth daily.       Allergies:  Allergies  Allergen Reactions  . Codeine Sulfate Nausea Only  . Epinephrine Other (See Comments)    Tremors  . Sulfa Antibiotics Nausea And Vomiting    Family History: Family History  Problem Relation Age of Onset  . Cancer Brother        Abdomina  . Heart disease Mother        leaky valves  . Heart attack Father 52  . Aneurysm Father   . Breast cancer Neg Hx     Social History:  reports that  has never smoked. she has never used smokeless tobacco. She reports that she drinks about 1.2 oz of alcohol per week. She reports that she does not use drugs.  ROS: UROLOGY Frequent Urination?: No Hard to postpone urination?: No Burning/pain with urination?: No Get up at night to urinate?: No Leakage of urine?: No Urine stream starts and stops?: No Trouble starting stream?: No Do you have to strain to urinate?: No Blood in urine?: No Urinary tract infection?: No Sexually transmitted disease?: No Injury to kidneys or bladder?: No Painful intercourse?: No Weak stream?: No Currently pregnant?: No Vaginal bleeding?: No Last menstrual period?: n  Gastrointestinal Nausea?: No Vomiting?: No Indigestion/heartburn?: No Diarrhea?: No Constipation?: No  Constitutional Fever: No Night sweats?: Yes Weight loss?: No Fatigue?: No  Skin Skin rash/lesions?: No Itching?: No  Eyes Blurred vision?: No Double vision?: No  Ears/Nose/Throat Sore throat?: No Sinus problems?: Yes  Hematologic/Lymphatic Swollen glands?: No Easy bruising?: No  Cardiovascular Leg swelling?: No Chest pain?: No  Respiratory Cough?: No Shortness of breath?: No  Endocrine Excessive thirst?: No  Musculoskeletal Back pain?: Yes Joint pain?: No  Neurological Headaches?: No Dizziness?: No  Psychologic Depression?: No Anxiety?: Yes  Physical Exam: BP 128/84   Pulse 86   Ht 5\' 6"  (1.676 m)   Wt  204 lb (92.5 kg)   BMI 32.93 kg/m   Constitutional: Well nourished. Alert and oriented, No acute distress. HEENT: [place] AT, moist mucus membranes. Trachea midline, no masses. Cardiovascular: No clubbing, cyanosis, or edema. Respiratory: Normal respiratory effort, no increased work of breathing. Skin: No rashes, bruises or suspicious lesions. Lymph: No cervical or inguinal adenopathy. Neurologic: Grossly intact, no focal deficits, moving all 4 extremities. Psychiatric: Normal mood and affect.   Laboratory Data: Lab Results  Component Value Date   CREATININE 1.02 (H) 05/06/2017  Urinalysis Results for orders placed or performed during the hospital encounter of 06/18/17  Urinalysis, Complete w Microscopic  Result Value Ref Range   Color, Urine YELLOW YELLOW   APPearance HAZY (A) CLEAR   Specific Gravity, Urine 1.025 1.005 - 1.030   pH 6.5 5.0 - 8.0   Glucose, UA NEGATIVE NEGATIVE mg/dL  Hgb urine dipstick LARGE (A) NEGATIVE   Bilirubin Urine NEGATIVE NEGATIVE   Ketones, ur NEGATIVE NEGATIVE mg/dL   Protein, ur 161100 (A) NEGATIVE mg/dL   Nitrite NEGATIVE NEGATIVE   Leukocytes, UA NEGATIVE NEGATIVE   Squamous Epithelial / LPF 0-5 (A) NONE SEEN   WBC, UA 0-5 0 - 5 WBC/hpf   RBC / HPF 6-30 0 - 5 RBC/hpf   Bacteria, UA FEW (A) NONE SEEN   Granular Casts, UA PRESENT    I have reviewed the labs.  Pertinent Imaging: CLINICAL DATA:  Left flank pain. Nephrolithiasis. Status post lithotripsy 4 weeks ago.  EXAM: ABDOMEN - 1 VIEW  COMPARISON:  09/30/2017  FINDINGS: Kidneys are partially obscured by overlying colonic gas and stool. At least 1 residual calculus measuring approximately 5 mm is seen overlying the lower pole of the left kidney. No other definite renal or ureteral calculi are identified. The bowel gas pattern is within normal limits. Right upper quadrant surgical clips from prior cholecystectomy.  IMPRESSION: Kidneys partially obscured by overlying bowel gas is  stool. At least 1 persistent calculus in lower pole of left kidney measuring approximately 5 mm.   Electronically Signed   By: Myles RosenthalJohn  Stahl M.D.   On: 10/15/2017 08:13  I have independently reviewed the films.    Assessment & Plan:    1. Nephrolithiasis  - 6 mm LLP stone, possibly symptomatic at times, remains in the kidney  - patient will increase water intake and RTC in 2 months for KUB  - Advised to contact our office or seek treatment in the ED if becomes febrile or pain/ vomiting are difficult control in order to arrange for emergent/urgent intervention  2. Microscopic hematuria Likely related to stone or underlying renal disease, however, strongly recommend cystoscopy  No delayed ureteral imaging, but given non smoking history, low risk for up upper tract TCC Agreeable to cystoscopy/ cytology if hematuria persists after definitive treatment for the stone Recheck UA in 2 months Contact us for any gross hematuria   Return in about 2 months (around 12/12/2017) for KUB and UA  .  Michiel CowboySHANNON Ardene Remley, PA-C  [facility] [address], [address] [place], [place] [address] [phone]

## 2017-10-14 ENCOUNTER — Ambulatory Visit (INDEPENDENT_AMBULATORY_CARE_PROVIDER_SITE_OTHER): Payer: Managed Care, Other (non HMO) | Admitting: Urology

## 2017-10-14 ENCOUNTER — Encounter: Payer: Self-pay | Admitting: Urology

## 2017-10-14 ENCOUNTER — Ambulatory Visit
Admission: RE | Admit: 2017-10-14 | Discharge: 2017-10-14 | Disposition: A | Discharge status: Home / Self Care | Payer: Managed Care, Other (non HMO) | Source: Ambulatory Visit | Attending: Urology | Admitting: Urology

## 2017-10-14 ENCOUNTER — Other Ambulatory Visit: Payer: Self-pay | Admitting: Urology

## 2017-10-14 VITALS — BP 128/84 | HR 86 | Ht 66.0 in | Wt 204.0 lb

## 2017-10-14 DIAGNOSIS — R109 Unspecified abdominal pain: Secondary | ICD-10-CM

## 2017-10-14 DIAGNOSIS — N200 Calculus of kidney: Secondary | ICD-10-CM | POA: Insufficient documentation

## 2017-10-14 DIAGNOSIS — Z87448 Personal history of other diseases of urinary system: Secondary | ICD-10-CM

## 2017-10-14 NOTE — Progress Notes (Signed)
KUB order in. 

## 2017-10-17 DIAGNOSIS — M7052 Other bursitis of knee, left knee: Secondary | ICD-10-CM | POA: Insufficient documentation

## 2017-10-19 ENCOUNTER — Other Ambulatory Visit: Payer: Self-pay | Admitting: Urology

## 2017-10-20 ENCOUNTER — Telehealth: Payer: Self-pay

## 2017-10-20 NOTE — Telephone Encounter (Signed)
Letter sent.

## 2017-10-20 NOTE — Telephone Encounter (Signed)
-----   Message from Harle BattiestShannon A McGowan, PA-C sent at 10/19/2017 12:17 PM EST ----- Please let Mrs. Kizzie BaneHughes know her stone analysis was negative for a stone.  It was organic material.

## 2017-11-03 ENCOUNTER — Other Ambulatory Visit: Payer: Self-pay | Admitting: Urology

## 2017-11-08 ENCOUNTER — Telehealth: Payer: Self-pay

## 2017-11-08 NOTE — Telephone Encounter (Signed)
-----   Message from Harle BattiestShannon A McGowan, PA-C sent at 11/08/2017  8:02 AM EST ----- Please let Mrs. Tracey Mosley know that her stone analysis was positive for a calcium oxalate stone.  She needs to drink 2.5 L of water daily.

## 2017-11-08 NOTE — Telephone Encounter (Signed)
Letter sent.

## 2017-12-14 NOTE — Progress Notes (Signed)
12/15/2017 4:19 PM   Tracey Mosley Nov 23, 1950 [id number]  Referring provider: Leotis Shames, MD [phone] [facility] [address] [facility] [place], [place] [address]  No chief complaint on file.   HPI: [name] who is s/p left ESWL who presents today for a two months follow up.  Background history [name] with a left renal stone with microscopic hematuria who was recommended to undergo cystoscopy but would like the stone addressed.  67 yo F with history of nephrolithiasis referred for evaluation of kidney stone.  She was referred to nephrology ([name]) for her further evaluation of proteinuria and microscopic.  As part of the work up, she underwent CT abdomen pelvis w/o contrast on 05/2017 which showed 6 mm LLP nonobstructive stone.   There was also incidental bladder wall thickening.  She does have occasional left flank pain which is mild.  No gross hematuria.  She is s/p ESWL x 2 (~2013 and 2015) in the past for stones previously followed by Dr. Evelene Croon.  She does no know what type of stone she makes.  Never smoker.  No previous hematuria work up of cystoscopy.   At her office visit on 06/18/2017 with Dr. Apolinar Junes, it was advised that the patient undergo a cystoscopy.  She was agreeable to this, but then she "Googled" it and became very frightened to have the procedure.   She would like to have her stone addressed and see if this will resolve the microscopic hematuria.    She underwent left ESWL on 09/16/2017 with Dr. Apolinar Junes.  After the procedure, she experienced some mild left flank pain.     Her KUB taken on 09/30/2017 demonstrates calcifications projected over the upper and lower pole of the left kidney similar to previous study.    Her KUB taken on 10/14/2017 demonstrated kidneys partially obscured by overlying bowel gas is stool. At least 1 persistent calculus in lower pole of left kidney measuring approximately 5 mm.    Her KUB taken on 12/15/2017 demonstrated small lower pole left  renal stones. No other focal abnormality is noted.  She is still having some mild left flank discomfort.  She is not finding it bothersome.  Patient denies any gross hematuria, dysuria or suprapubic/flank pain.  Patient denies any fevers, chills, nausea or vomiting.  Her UA today is positive for 11-30 RBC's.    PMH: Past Medical History:  Diagnosis Date  . Hyperlipidemia   . Hypertension   . Kidney stones   . Osteoporosis   . Thyroid disease     Surgical History: Past Surgical History:  Procedure Laterality Date  . CHOLECYSTECTOMY    . EXTRACORPOREAL SHOCK WAVE LITHOTRIPSY Left 09/09/2017   Procedure: EXTRACORPOREAL SHOCK WAVE LITHOTRIPSY (ESWL);  Surgeon: Riki Altes, MD;  Location: [facility] ORS;  Service: Urology;  Laterality: Left;  . EXTRACORPOREAL SHOCK WAVE LITHOTRIPSY Left 09/16/2017   Procedure: EXTRACORPOREAL SHOCK WAVE LITHOTRIPSY (ESWL);  Surgeon: Vanna Scotland, MD;  Location: [facility] ORS;  Service: Urology;  Laterality: Left;  . LITHOTRIPSY      Home Medications:  Allergies as of 12/15/2017      Reactions   Codeine Sulfate Nausea Only   Epinephrine Other (See Comments)   Tremors   Sulfa Antibiotics Nausea And Vomiting      Medication List        Accurate as of 12/15/17  4:19 PM. Always use your most recent med list.          escitalopram 20 MG  tablet Commonly known as:  LEXAPRO TAKE 1 TABLET BY MOUTH ONCE DAILY   fluticasone 50 MCG/ACT nasal spray Commonly known as:  FLONASE Place 1 spray into both nostrils daily.   hydrochlorothiazide 25 MG tablet Commonly known as:  HYDRODIURIL Take 1 tablet (25 mg total) by mouth daily.   levothyroxine 50 MCG tablet Commonly known as:  SYNTHROID, LEVOTHROID Take 50 mcg by mouth daily before breakfast.   lisinopril 40 MG tablet Commonly known as:  PRINIVIL,ZESTRIL Take 40 mg by mouth daily.   MULTI-VITAMINS Tabs Take by mouth.   NIFEdipine 30 MG 24 hr tablet Commonly known as:   PROCARDIA-XL/ADALAT-CC/NIFEDICAL-XL Take 1 tablet (30 mg total) by mouth daily.   simvastatin 20 MG tablet Commonly known as:  ZOCOR TAKE 1 TABLET BY MOUTH  NIGHTLY   tamsulosin 0.4 MG Caps capsule Commonly known as:  FLOMAX Take 1 capsule (0.4 mg total) by mouth daily.       Allergies:  Allergies  Allergen Reactions  . Codeine Sulfate Nausea Only  . Epinephrine Other (See Comments)    Tremors  . Sulfa Antibiotics Nausea And Vomiting    Family History: Family History  Problem Relation Age of Onset  . Cancer Brother        Abdomina  . Heart disease Mother        leaky valves  . Heart attack Father [id number]  . Aneurysm Father   . Breast cancer Neg Hx     Social History:  reports that  has never smoked. she has never used smokeless tobacco. She reports that she drinks about 1.2 oz of alcohol per week. She reports that she does not use drugs.  ROS: UROLOGY Frequent Urination?: No Hard to postpone urination?: No Burning/pain with urination?: No Get up at night to urinate?: No Leakage of urine?: No Urine stream starts and stops?: No Trouble starting stream?: No Do you have to strain to urinate?: No Blood in urine?: No Urinary tract infection?: No Sexually transmitted disease?: No Injury to kidneys or bladder?: No Painful intercourse?: No Weak stream?: No Currently pregnant?: No Vaginal bleeding?: No  Gastrointestinal Nausea?: No Vomiting?: No Indigestion/heartburn?: Yes Diarrhea?: No Constipation?: No  Constitutional Fever: No Night sweats?: Yes Weight loss?: No Fatigue?: No  Skin Skin rash/lesions?: No Itching?: No  Eyes Blurred vision?: No Double vision?: No  Ears/Nose/Throat Sore throat?: No Sinus problems?: Yes  Hematologic/Lymphatic Swollen glands?: No Easy bruising?: No  Cardiovascular Leg swelling?: No Chest pain?: No  Respiratory Cough?: No Shortness of breath?: No  Endocrine Excessive thirst?: No  Musculoskeletal Back  pain?: No Joint pain?: No  Neurological Headaches?: No Dizziness?: No  Psychologic Depression?: No Anxiety?: Yes  Physical Exam: BP 120/80   Pulse 83   Resp 16   Ht 5\' 6"  (1.676 m)   Wt 206 lb 12.8 oz (93.8 kg)   SpO2 95%   BMI 33.38 kg/m   Constitutional: Well nourished. Alert and oriented, No acute distress. HEENT: [place] AT, moist mucus membranes. Trachea midline, no masses. Cardiovascular: No clubbing, cyanosis, or edema. Respiratory: Normal respiratory effort, no increased work of breathing. Skin: No rashes, bruises or suspicious lesions. Lymph: No cervical or inguinal adenopathy. Neurologic: Grossly intact, no focal deficits, moving all 4 extremities. Psychiatric: Normal mood and affect.   Laboratory Data: Lab Results  Component Value Date   CREATININE 1.02 (H) 05/06/2017  Urinalysis 11-30 RBC's.  See Epic.   I have reviewed the labs.  Pertinent Imaging: CLINICAL DATA:  Follow-up nephrolithiasis  EXAM: ABDOMEN - 1 VIEW  COMPARISON:  10/14/2017  FINDINGS: Scattered large and small bowel gas is noted. Changes of prior cholecystectomy are seen. Small calcifications are again identified over the lower pole of the left kidney. No other definitive renal or ureteral stones are seen. Degenerative changes of lumbar spine are noted with mild scoliosis stable from the prior exam.  IMPRESSION: Small lower pole left renal stones. No other focal abnormality is noted.   Electronically Signed   By: Alcide Clever M.D.   On: 12/16/2017 08:38 I have independently reviewed the films.    Assessment & Plan:    1. Nephrolithiasis  - 6 mm LLP stone, possibly symptomatic at times, remains in the kidney  - patient will increase water intake - she does not want to pursue further treatment at this time  - Advised to contact our office or seek treatment in the ED if becomes febrile or pain/ vomiting are difficult control in order to arrange for emergent/urgent  intervention  2. Microscopic hematuria Likely related to stone or underlying renal disease, however, strongly recommend cystoscopy  No delayed ureteral imaging, but given non smoking history, low risk for up upper tract TCC Patient with persistent AMH, since patient is not wanting to have further treatment for this stone at this time she is agreeable to cystoscopy/ cytology  RTC for cystoscopy     Return for cystoscopy .  Michiel Cowboy, PA-C  [facility] [address] [place], [place] [address] [phone]

## 2017-12-15 ENCOUNTER — Ambulatory Visit
Admission: RE | Admit: 2017-12-15 | Discharge: 2017-12-15 | Disposition: A | Discharge status: Home / Self Care | Payer: Managed Care, Other (non HMO) | Source: Ambulatory Visit | Attending: Urology | Admitting: Urology

## 2017-12-15 ENCOUNTER — Encounter: Payer: Self-pay | Admitting: Urology

## 2017-12-15 ENCOUNTER — Ambulatory Visit: Payer: Managed Care, Other (non HMO) | Admitting: Urology

## 2017-12-15 ENCOUNTER — Other Ambulatory Visit: Payer: Self-pay | Admitting: Radiology

## 2017-12-15 VITALS — BP 120/80 | HR 83 | Resp 16 | Ht 66.0 in | Wt 206.8 lb

## 2017-12-15 DIAGNOSIS — N200 Calculus of kidney: Secondary | ICD-10-CM

## 2017-12-15 DIAGNOSIS — Z87448 Personal history of other diseases of urinary system: Secondary | ICD-10-CM

## 2017-12-15 LAB — URINALYSIS, COMPLETE
Bilirubin, UA: NEGATIVE
Glucose, UA: NEGATIVE
KETONES UA: NEGATIVE
Leukocytes, UA: NEGATIVE
NITRITE UA: NEGATIVE
PH UA: 5 (ref 5.0–7.5)
SPEC GRAV UA: 1.025 (ref 1.005–1.030)
Urobilinogen, Ur: 1 mg/dL (ref 0.2–1.0)

## 2017-12-15 LAB — MICROSCOPIC EXAMINATION

## 2017-12-15 NOTE — Patient Instructions (Addendum)
Cystoscopy  Cystoscopy is a procedure that is used to help diagnose and sometimes treat conditions that affect that lower urinary tract. The lower urinary tract includes the bladder and the tube that drains urine from the bladder out of the body (urethra). Cystoscopy is performed with a thin, tube-shaped instrument with a light and camera at the end (cystoscope). The cystoscope may be hard (rigid) or flexible, depending on the goal of the procedure.The cystoscope is inserted through the urethra, into the bladder.  Cystoscopy may be recommended if you have:   Urinary tractinfections that keep coming back (recurring).   Blood in the urine (hematuria).   Loss of bladder control (urinary incontinence) or an overactive bladder.   Unusual cells found in a urine sample.   A blockage in the urethra.   Painful urination.   An abnormality in the bladder found during an intravenous pyelogram (IVP) or CT scan.    Cystoscopy may also be done to remove a sample of tissue to be examined under a microscope (biopsy).  Tell a health care provider about:   Any allergies you have.   All medicines you are taking, including vitamins, herbs, eye drops, creams, and over-the-counter medicines.   Any problems you or family members have had with anesthetic medicines.   Any blood disorders you have.   Any surgeries you have had.   Any medical conditions you have.   Whether you are pregnant or may be pregnant.  What are the risks?  Generally, this is a safe procedure. However, problems may occur, including:   Infection.   Bleeding.   Allergic reactions to medicines.   Damage to other structures or organs.    What happens before the procedure?   Ask your health care provider about:  ? Changing or stopping your regular medicines. This is especially important if you are taking diabetes medicines or blood thinners.  ? Taking medicines such as aspirin and ibuprofen. These medicines can thin your blood. Do not take these medicines  before your procedure if your health care provider instructs you not to.   Follow instructions from your health care provider about eating or drinking restrictions.   You may be given antibiotic medicine to help prevent infection.   You may have an exam or testing, such as X-rays of the bladder, urethra, or kidneys.   You may have urine tests to check for signs of infection.   Plan to have someone take you home after the procedure.  What happens during the procedure?   To reduce your risk of infection,your health care team will wash or sanitize their hands.   You will be given one or more of the following:  ? A medicine to help you relax (sedative).  ? A medicine to numb the area (local anesthetic).   The area around the opening of your urethra will be cleaned.   The cystoscope will be passed through your urethra into your bladder.   Germ-free (sterile)fluid will flow through the cystoscope to fill your bladder. The fluid will stretch your bladder so that your surgeon can clearly examine your bladder walls.   The cystoscope will be removed and your bladder will be emptied.  The procedure may vary among health care providers and hospitals.  What happens after the procedure?   You may have some soreness or pain in your abdomen and urethra. Medicines will be available to help you.   You may have some blood in your urine.   Do not   drive for 24 hours if you received a sedative.  This information is not intended to replace advice given to you by your health care provider. Make sure you discuss any questions you have with your health care provider.  Document Released: 09/11/2000 Document Revised: 01/23/2016 Document Reviewed: 08/01/2015  Elsevier Interactive Patient Education  2018 [employer] Inc[employer]

## 2018-01-19 DIAGNOSIS — M8589 Other specified disorders of bone density and structure, multiple sites: Secondary | ICD-10-CM | POA: Insufficient documentation

## 2018-02-02 ENCOUNTER — Encounter: Payer: Self-pay | Admitting: Urology

## 2018-02-02 ENCOUNTER — Ambulatory Visit: Payer: Managed Care, Other (non HMO) | Admitting: Urology

## 2018-02-02 VITALS — BP 159/78 | HR 85 | Ht 65.0 in | Wt 205.0 lb

## 2018-02-02 DIAGNOSIS — R3129 Other microscopic hematuria: Secondary | ICD-10-CM | POA: Diagnosis not present

## 2018-02-02 DIAGNOSIS — N200 Calculus of kidney: Secondary | ICD-10-CM

## 2018-02-02 LAB — URINALYSIS, COMPLETE
BILIRUBIN UA: NEGATIVE
Glucose, UA: NEGATIVE
KETONES UA: NEGATIVE
LEUKOCYTES UA: NEGATIVE
Nitrite, UA: NEGATIVE
PH UA: 6 (ref 5.0–7.5)
SPEC GRAV UA: 1.01 (ref 1.005–1.030)
UUROB: 0.2 mg/dL (ref 0.2–1.0)

## 2018-02-02 LAB — MICROSCOPIC EXAMINATION
Bacteria, UA: NONE SEEN
EPITHELIAL CELLS (NON RENAL): NONE SEEN /HPF (ref 0–10)
WBC UA: NONE SEEN /HPF (ref 0–5)

## 2018-02-02 MED ORDER — LIDOCAINE HCL URETHRAL/MUCOSAL 2 % EX GEL
1.0000 | Freq: Once | CUTANEOUS | Status: AC
Start: 2018-02-02 — End: 2018-02-02
  Administered 2018-02-02: 1 via URETHRAL

## 2018-02-02 MED ORDER — CIPROFLOXACIN HCL 500 MG PO TABS
500.0000 mg | ORAL_TABLET | Freq: Once | ORAL | Status: AC
  Administered 2018-02-02: 500 mg via ORAL

## 2018-02-02 NOTE — Progress Notes (Signed)
   02/02/18  CC:  Chief Complaint  Patient presents with  . Cysto    HPI: 67 year old female with history of nephrolithiasis and microscopic hematuria who presents today for cystoscopy for further evaluation of blood in her urine.  She is been quite anxious about having this done.  Ultimately, she is agreeable to having this today.  She did undergo shockwave lithotripsy for nonobstructing stone.  This was not effective.  She is currently a symptomatically of her stone.  Blood pressure (!) 159/78, pulse 85, height  (1.651 m), weight 205 lb (93 kg). NED. A&Ox3.   No respiratory distress   Abd soft, NT, ND Normal external genitalia with patent urethral meatus  Cystoscopy Procedure Note  Patient identification was confirmed, informed consent was obtained, and patient was prepped using Betadine solution.  Lidocaine jelly was administered per urethral meatus.    Preoperative abx where received prior to procedure.    Procedure: - Flexible cystoscope introduced, without any difficulty.   - Thorough search of the bladder revealed:    normal urethral meatus    normal urothelium    no stones    no ulcers     no tumors    no urethral polyps    no trabeculation  - Ureteral orifices were normal in position and appearance with some inflammatory trigonitis noted but no concern for underlying malignancy  Post-Procedure: - Patient tolerated the procedure well  Assessment/ Plan:  1. Microscopic hematuria Cystoscopy today fairly unremarkable other than some mild trigonitis which may be the source of her microscopic blood versus nonobstructing stone No delayed imaging to evaluate ureters although she is very low risk, as such we will defer this study and she is agreeable with the plan - Urinalysis, Complete - ciprofloxacin (CIPRO) tablet 500 mg - lidocaine (XYLOCAINE) 2 % jelly 1 application  2. Nephrolithiasis Status post failed shockwave lithotripsy Currently asymptomatic We  will follow stone with KUB in 9 months - DG Abd 1 View; Future   Return in about 9 months (around 11/05/2018) for KUB with shannon for f/u stones.  Vanna Scotland, MD

## 2018-02-27 ENCOUNTER — Other Ambulatory Visit: Payer: Self-pay | Admitting: Internal Medicine

## 2018-04-06 ENCOUNTER — Other Ambulatory Visit: Payer: Self-pay | Admitting: *Deleted

## 2018-04-06 MED ORDER — NIFEDIPINE ER OSMOTIC RELEASE 30 MG PO TB24: 30.0000 mg | ORAL_TABLET | Freq: Every day | ORAL | 0 refills | Status: DC

## 2018-04-27 ENCOUNTER — Encounter: Payer: Self-pay | Admitting: Internal Medicine

## 2018-04-27 ENCOUNTER — Ambulatory Visit: Payer: Managed Care, Other (non HMO) | Admitting: Internal Medicine

## 2018-04-27 VITALS — BP 128/74 | HR 65 | Ht 66.0 in | Wt 205.5 lb

## 2018-04-27 DIAGNOSIS — I10 Essential (primary) hypertension: Secondary | ICD-10-CM

## 2018-04-27 NOTE — Progress Notes (Signed)
Follow-up Outpatient Visit Date: 04/27/2018  Primary Care Provider: Leotis ShamesSingh, Jasmine, MD [address] [facility] [address] [facility] [place] [id number]  Chief Complaint: Follow-up hypertension  HPI:  [name] is a 67 y.o. year-old female with history of  hypertension, hyperlipidemia, chronic kidney disease stage III, hypothyroidism, and obesity, who presents for follow-up of hypertension.  I last saw her in November, at which time she was doing well.  Blood pressure was reasonably well controlled, though diastolic was borderline high at 80 mmHg.  In an effort to simplify her medications, we opted to discontinue hydralazine.  Since our last visit, she was found to have renal stones in the setting of microscopic hematuria.  She continues to follow with Dr. Apolinar JunesBrandon (urology).  Today, [name] reports feeling well.  She notes occasional swelling in her ankles associated with the heat.  She also has occasional brief skipped beats without associated symptoms.  She denies chest pain, shortness of breath, lightheadedness, and orthopnea.  She is tolerating her current medications well.  --------------------------------------------------------------------------------------------------  Cardiovascular History & Procedures: Cardiovascular Problems:  Uncontrolled hypertension  Risk Factors:  Hypertension, hyperlipidemia, and obesity  Cath/PCI:  None.  CV Surgery:  None.  EP Procedures and Devices:  None.  Non-Invasive Evaluation(s):  Pharmacologic MPI (06/08/17): Low risk study without ischemia or scar. Normal LVEF.  TTE (05/18/17): Normal LV size and function. LVEF 55-60%. Normal RV size and function. No significant valvular abnormalities.  Recent CV Pertinent Labs: Lab Results  Component Value Date   K 4.2 05/06/2017   K 4.5 03/17/2014   BUN 22 (H) 05/06/2017   BUN 22 (H) 03/17/2014   CREATININE 1.02 (H) 05/06/2017   CREATININE 0.84 03/17/2014    Past medical and  surgical history were reviewed and updated in EPIC.  No outpatient medications have been marked as taking for the 04/27/18 encounter (Office Visit) with Blanchard Willhite, Cristal Deerhristopher, MD.    Allergies: Codeine sulfate; Epinephrine; and Sulfa antibiotics  Social History   Tobacco Use  . Smoking status: Never Smoker  . Smokeless tobacco: Never Used  Substance Use Topics  . Alcohol use: Yes    Alcohol/week: 1.2 oz    Types: 2 Cans of beer per week    Comment: once a month  . Drug use: No    Family History  Problem Relation Age of Onset  . Cancer Brother        Abdomina  . Heart disease Mother        leaky valves  . Heart attack Father [id number]  . Aneurysm Father   . Breast cancer Neg Hx     Review of Systems: A 12-system review of systems was performed and was negative except as noted in the HPI.  --------------------------------------------------------------------------------------------------  Physical Exam: BP 128/74 (BP Location: Left Arm, Patient Position: Sitting, Cuff Size: Large)   Pulse 65   Ht 5\' 6"  (1.676 m)   Wt 205 lb 8 oz (93.2 kg)   BMI 33.17 kg/m   General:  NAD. HEENT: No conjunctival pallor or scleral icterus. Moist mucous membranes.  OP clear. Neck: Supple without lymphadenopathy, thyromegaly, JVD, or HJR. Lungs: Normal work of breathing. Clear to auscultation bilaterally without wheezes or crackles. Heart: Regular rate and rhythm without murmurs, rubs, or gallops. Non-displaced PMI. Abd: Bowel sounds present. Soft, NT/ND without hepatosplenomegaly Ext: No lower extremity edema. Radial, PT, and DP pulses are 2+ bilaterally. Skin: Warm and dry without rash.  EKG:  NSR with inferior Q-waves.  Otherwise, no significant  abnormalities.  No change from prior tracing on 08/04/17.  Lab Results  Component Value Date   WBC 9.1 03/17/2014   HGB 14.1 03/17/2014   HCT 42.0 03/17/2014   MCV 94 03/17/2014   PLT 206 03/17/2014    Lab Results  Component Value Date   NA 141  05/06/2017   K 4.2 05/06/2017   CL 106 05/06/2017   CO2 25 05/06/2017   BUN 22 (H) 05/06/2017   CREATININE 1.02 (H) 05/06/2017   GLUCOSE 100 (H) 05/06/2017   ALT 25 05/06/2017    No results found for: CHOL, HDL, LDLCALC, LDLDIRECT, TRIG, CHOLHDL  --------------------------------------------------------------------------------------------------  ASSESSMENT AND PLAN: Hypertension Blood pressure well-controlled.  We will continue her current regimen of HCTZ 25 mg daily, lisinopril 40 mg daily, and nifedipine 30 mg daily.  BMP obtained by [name] in March showed stable creatinine and potassium.  Follow-up: Return to clinic in 1 year.  [name], MD 04/27/2018 10:40 PM

## 2018-04-27 NOTE — Patient Instructions (Signed)
Medication Instructions:  Your physician recommends that you continue on your current medications as directed. Please refer to the Current Medication list given to you today.   Labwork: none  Testing/Procedures: none  Follow-Up: Your physician wants you to follow-up in: 12 MONTHS WITH DR END.  You will receive a reminder letter in the mail two months in advance. If you don't receive a letter, please call our office to schedule the follow-up appointment.  If you need a refill on your cardiac medications before your next appointment, please call your pharmacy.   

## 2018-06-29 ENCOUNTER — Other Ambulatory Visit: Payer: Self-pay | Admitting: Internal Medicine

## 2018-07-21 ENCOUNTER — Other Ambulatory Visit: Payer: Self-pay | Admitting: Internal Medicine

## 2018-07-21 DIAGNOSIS — Z1231 Encounter for screening mammogram for malignant neoplasm of breast: Secondary | ICD-10-CM

## 2018-07-22 DIAGNOSIS — E538 Deficiency of other specified B group vitamins: Secondary | ICD-10-CM | POA: Insufficient documentation

## 2018-07-26 ENCOUNTER — Telehealth: Payer: Self-pay

## 2018-07-26 MED ORDER — NIFEDIPINE ER OSMOTIC RELEASE 30 MG PO TB24: 30.0000 mg | ORAL_TABLET | Freq: Every day | ORAL | 3 refills | Status: DC

## 2018-07-26 NOTE — Telephone Encounter (Signed)
RX for Nifedipine XL sent to [facility]. 90 day supply.

## 2018-08-29 ENCOUNTER — Ambulatory Visit
Admission: RE | Admit: 2018-08-29 | Discharge: 2018-08-29 | Disposition: A | Discharge status: Home / Self Care | Payer: Managed Care, Other (non HMO) | Source: Ambulatory Visit | Attending: Internal Medicine | Admitting: Internal Medicine

## 2018-08-29 DIAGNOSIS — Z1231 Encounter for screening mammogram for malignant neoplasm of breast: Secondary | ICD-10-CM | POA: Diagnosis not present

## 2018-08-31 ENCOUNTER — Other Ambulatory Visit: Payer: Self-pay | Admitting: Internal Medicine

## 2018-08-31 DIAGNOSIS — N6320 Unspecified lump in the left breast, unspecified quadrant: Secondary | ICD-10-CM

## 2018-08-31 DIAGNOSIS — R928 Other abnormal and inconclusive findings on diagnostic imaging of breast: Secondary | ICD-10-CM

## 2018-09-08 ENCOUNTER — Ambulatory Visit
Admission: RE | Admit: 2018-09-08 | Discharge: 2018-09-08 | Disposition: A | Discharge status: Home / Self Care | Payer: Managed Care, Other (non HMO) | Source: Ambulatory Visit | Attending: Internal Medicine | Admitting: Internal Medicine

## 2018-09-08 DIAGNOSIS — N6320 Unspecified lump in the left breast, unspecified quadrant: Secondary | ICD-10-CM | POA: Insufficient documentation

## 2018-09-08 DIAGNOSIS — R928 Other abnormal and inconclusive findings on diagnostic imaging of breast: Secondary | ICD-10-CM | POA: Insufficient documentation

## 2018-09-12 ENCOUNTER — Other Ambulatory Visit: Payer: Managed Care, Other (non HMO)

## 2018-09-12 ENCOUNTER — Ambulatory Visit: Payer: Managed Care, Other (non HMO)

## 2018-10-31 ENCOUNTER — Other Ambulatory Visit: Payer: Self-pay | Admitting: *Deleted

## 2018-10-31 ENCOUNTER — Telehealth: Payer: Self-pay | Admitting: Internal Medicine

## 2018-10-31 MED ORDER — NIFEDIPINE ER OSMOTIC RELEASE 30 MG PO TB24: 30.0000 mg | ORAL_TABLET | Freq: Every day | ORAL | 2 refills | Status: DC

## 2018-10-31 NOTE — Telephone Encounter (Signed)
Requested Prescriptions   Signed Prescriptions Disp Refills  . NIFEdipine (PROCARDIA-XL/NIFEDICAL-XL) 30 MG 24 hr tablet 90 tablet 2    Sig: Take 1 tablet (30 mg total) by mouth daily.    Authorizing Provider: END, CHRISTOPHER    Ordering User: Kendrick Fries

## 2018-10-31 NOTE — Telephone Encounter (Signed)
°*  STAT* If patient is at the pharmacy, call can be transferred to refill team.   1. Which medications need to be refilled? (please list name of each medication and dose if known)  NIFEdipine 30 MG - 1 tablet daily   2. Which pharmacy/location (including street and city if local pharmacy) is medication to be sent to? [facility] in [place]  3. Do they need a 30 day or 90 day supply? Patient requesting more than 90 day if possible.

## 2018-10-31 NOTE — Telephone Encounter (Signed)
Requested Prescriptions   Signed Prescriptions Disp Refills  . NIFEdipine (PROCARDIA-XL/NIFEDICAL-XL) 30 MG 24 hr tablet 90 tablet 2    Sig: Take 1 tablet (30 mg total) by mouth daily.    Authorizing Provider: END, CHRISTOPHER    Ordering User: Margueritte Guthridge C   

## 2018-11-03 NOTE — Progress Notes (Signed)
11/04/2018  9:52 AM   Tracey Mosley 10/07/1950 [id number]  Referring provider: Leotis ShamesSingh, Jasmine, MD [address] [facility] [place], [place] [address]  Chief Complaint  Patient presents with  . Nephrolithiasis    HPI: Tracey Mosley is a 68 y.o. female Caucasian with a history of hematuria, left nephrolithiasis and urgency presents today for follow up.    History of hematuria Non smoker.  Stage 3 CKD.  CT abdomen pelvis w/o contrast on 05/2017 which showed 6 mm LLP nonobstructive stone.   There was also incidental bladder wall thickening.  Cystoscopy in 01/2018 was NED.   She denies any gross hematuria.  Her UA today is positive for 3-10 RBC's.   Left nephrolithiasis CT abdomen pelvis w/o contrast on 05/2017 which showed 6 mm LLP nonobstructive stone.   She is s/p ESWL x 2 (~2013 and 2015) in the past for stones previously followed by Dr. Evelene CroonWolff.  She does no know what type of stone she makes.   She underwent left ESWL on 09/16/2017 with Dr. Apolinar JunesBrandon.  Her KUB taken on 09/30/2017 demonstrates calcifications projected over the upper and lower pole of the left kidney similar to previous study.  Her KUB taken on 10/14/2017 demonstrated kidneys partially obscured by overlying bowel gas is stool. At least 1 persistent calculus in lower pole of left kidney measuring approximately 5 mm.  Her UA today (11/04/2018) showed RBC 3-10, BLO 3+, PRO trace.  KUB on 11/04/2018 revealed a persistent lower pole kidney stone in the left kidney measuring 5 mm.   Patient says that she has a little stinging through her left flank and she feels like the stone is moving.  Patient admitted today to suffering from right-sided arm and leg spasms.  Urgency Patient denies urinary symptoms aside from nocturia 1 x that started a couple months ago and urge incontinence with leakage.  Patient retired in December and is still adjusting; nocturia started around then.  Urge incontinence started before she retired; she  is unable to hold it and will leak.  She admits to drinking water, 2 x coffee, hot tea with cold weather, no juices or energy drinks, and the occasional Coke.  Patient denied drinking alcohol.  Advised the patient to consider not drinking Coke, and patient at this point is not bothered sufficiently to try a medication.    PMH: Past Medical History:  Diagnosis Date  . Hyperlipidemia   . Hypertension   . Kidney stones   . Osteoporosis   . Thyroid disease     Surgical History: Past Surgical History:  Procedure Laterality Date  . CHOLECYSTECTOMY    . EXTRACORPOREAL SHOCK WAVE LITHOTRIPSY Left 09/09/2017   Procedure: EXTRACORPOREAL SHOCK WAVE LITHOTRIPSY (ESWL);  Surgeon: Riki AltesStoioff, Scott C, MD;  Location: [facility] ORS;  Service: Urology;  Laterality: Left;  . EXTRACORPOREAL SHOCK WAVE LITHOTRIPSY Left 09/16/2017   Procedure: EXTRACORPOREAL SHOCK WAVE LITHOTRIPSY (ESWL);  Surgeon: Vanna ScotlandBrandon, Ashley, MD;  Location: [facility] ORS;  Service: Urology;  Laterality: Left;  . LITHOTRIPSY      Home Medications:  Allergies as of 11/04/2018      Reactions   Codeine Sulfate Nausea Only   Epinephrine Other (See Comments)   Tremors   Sulfa Antibiotics Nausea And Vomiting      Medication List       Accurate as of November 04, 2018 11:59 PM. Always use your most recent med list.        escitalopram 20 MG tablet Commonly known  as:  LEXAPRO TAKE 1 TABLET BY MOUTH ONCE DAILY   fluticasone 50 MCG/ACT nasal spray Commonly known as:  FLONASE Place 1 spray into both nostrils daily.   hydrochlorothiazide 25 MG tablet Commonly known as:  HYDRODIURIL TAKE 1 TABLET BY MOUTH  DAILY   levothyroxine 50 MCG tablet Commonly known as:  SYNTHROID, LEVOTHROID Take 50 mcg by mouth daily before breakfast.   lisinopril 40 MG tablet Commonly known as:  PRINIVIL,ZESTRIL Take 40 mg by mouth daily.   MULTI-VITAMINS Tabs Take by mouth.   NIFEdipine 30 MG 24 hr tablet Commonly known as:   PROCARDIA-XL/NIFEDICAL-XL Take 1 tablet (30 mg total) by mouth daily.   simvastatin 20 MG tablet Commonly known as:  ZOCOR TAKE 1 TABLET BY MOUTH  NIGHTLY       Allergies:  Allergies  Allergen Reactions  . Codeine Sulfate Nausea Only  . Epinephrine Other (See Comments)    Tremors  . Sulfa Antibiotics Nausea And Vomiting    Family History: Family History  Problem Relation Age of Onset  . Cancer Brother        Abdomina  . Heart disease Mother        leaky valves  . Heart attack Father 20  . Aneurysm Father   . Breast cancer Neg Hx     Social History:  reports that she has never smoked. She has never used smokeless tobacco. She reports current alcohol use of about 2.0 standard drinks of alcohol per week. She reports that she does not use drugs.  ROS: UROLOGY Frequent Urination?: No Hard to postpone urination?: No Burning/pain with urination?: No Get up at night to urinate?: No Leakage of urine?: No Urine stream starts and stops?: No Trouble starting stream?: No Do you have to strain to urinate?: No Blood in urine?: No Urinary tract infection?: No Sexually transmitted disease?: No Injury to kidneys or bladder?: No Painful intercourse?: No Weak stream?: No Currently pregnant?: No Vaginal bleeding?: No Last menstrual period?: n  Gastrointestinal Nausea?: No Vomiting?: No Indigestion/heartburn?: Yes Diarrhea?: No Constipation?: No  Constitutional Fever: No Night sweats?: Yes Weight loss?: No Fatigue?: No  Skin Skin rash/lesions?: No Itching?: No  Eyes Blurred vision?: No Double vision?: No  Ears/Nose/Throat Sore throat?: No Sinus problems?: No  Hematologic/Lymphatic Swollen glands?: No Easy bruising?: No  Cardiovascular Leg swelling?: No Chest pain?: No  Respiratory Cough?: No Shortness of breath?: No  Endocrine Excessive thirst?: No  Musculoskeletal Back pain?: No Joint pain?: No  Neurological Headaches?: No Dizziness?:  No  Psychologic Depression?: No Anxiety?: No  Physical Exam: BP (!) 152/79 (BP Location: Left Arm, Patient Position: Sitting, Cuff Size: Normal)   Pulse 79   Ht 5\' 6"  (1.676 m)   Wt 202 lb 8 oz (91.9 kg)   BMI 32.68 kg/m   Constitutional: Well nourished. Alert and oriented, No acute distress. Cardiovascular: No clubbing, cyanosis, or edema. Respiratory: Normal respiratory effort, no increased work of breathing. Skin: No rashes, bruises or suspicious lesions. Neurologic: Grossly intact, no focal deficits, moving all 4 extremities. Psychiatric: Normal mood and affect.   Laboratory Data:  Urinalysis UA on 11/04/2018: RBC 3-10, BLO 3+, PRO trace.  See Epic.  I have reviewed the labs.  Pertinent Imaging: CLINICAL DATA:  Kidney stones.  EXAM: ABDOMEN - 1 VIEW  COMPARISON:  KUB 12/15/2017.  FINDINGS: Surgical clips right upper quadrant. Soft tissue structures are unremarkable. No bowel distention. Stool noted throughout the colon. Degenerative changes scoliosis lumbar spine and both hips.  IMPRESSION: No acute abnormality.   Electronically Signed   By: Maisie Fus  Register   On: 11/04/2018 15:07  I have independently reviewed the films and appreciate the left lower pole stone.     Assessment & Plan:    1. Nephrolithiasis - 5 mm LLP stone remains in the kidney - explained that if she is wanting definitive treatment for her stone, she will need to have URS/LL/ureteral stent placement, but I did caution her that her left side pain is more likely MSK in nature and it would still persist after she had the treatment for her stone.  She would like to continue to manage her stone conservatively at this time.   - Advised to contact our office or seek treatment in the ED if becomes febrile or pain/vomiting are difficult control in order to arrange for emergent/urgent intervention - RTC in 6 months for KUB   2. History of hematuria Hematuria work up completed in 01/2018 -  findings positive for left renal stone No report of gross hematuria  UA today 3-10 RBC's  RTC in 6 months for UA - patient to report any gross hematuria in the interim    3. Urge incontinence and nocturia -discussed behavioral therapies, bladder training, bladder control strategies and pelvic floor muscle training - patient does not desire a referral to PT at this time - encouraged to eliminate soda from her diet - offered medical therapy with anticholinergic therapy or beta-3 adrenergic receptor agonist - patient deferred at this time - RTC in 6 months for PVR and symptom recheck   Return in about 6 months (around 05/05/2019) for KUB, UA and PVR .  Michiel Cowboy, PA-C  [facility] [address], [address] [place], [place] [address] [phone]  I, Duanne Moron, am acting as a Neurosurgeon for [employer], PA-C.   I have reviewed the above documentation for accuracy and completeness, and I agree with the above.    Michiel Cowboy, PA-C

## 2018-11-04 ENCOUNTER — Ambulatory Visit
Admission: RE | Admit: 2018-11-04 | Discharge: 2018-11-04 | Disposition: A | Discharge status: Home / Self Care | Payer: Medicare Other | Source: Ambulatory Visit | Attending: Urology | Admitting: Urology

## 2018-11-04 ENCOUNTER — Ambulatory Visit (INDEPENDENT_AMBULATORY_CARE_PROVIDER_SITE_OTHER): Payer: Medicare Other | Admitting: Urology

## 2018-11-04 ENCOUNTER — Encounter: Payer: Self-pay | Admitting: Urology

## 2018-11-04 VITALS — BP 152/79 | HR 79 | Ht 66.0 in | Wt 202.5 lb

## 2018-11-04 DIAGNOSIS — R3129 Other microscopic hematuria: Secondary | ICD-10-CM | POA: Diagnosis not present

## 2018-11-04 DIAGNOSIS — N200 Calculus of kidney: Secondary | ICD-10-CM

## 2018-11-04 DIAGNOSIS — R3915 Urgency of urination: Secondary | ICD-10-CM

## 2018-11-04 LAB — URINALYSIS, COMPLETE
Bilirubin, UA: NEGATIVE
GLUCOSE, UA: NEGATIVE
Ketones, UA: NEGATIVE
Leukocytes, UA: NEGATIVE
NITRITE UA: NEGATIVE
Specific Gravity, UA: 1.02 (ref 1.005–1.030)
Urobilinogen, Ur: 0.2 mg/dL (ref 0.2–1.0)
pH, UA: 6 (ref 5.0–7.5)

## 2018-11-04 LAB — MICROSCOPIC EXAMINATION
Bacteria, UA: NONE SEEN
Epithelial Cells (non renal): NONE SEEN /hpf (ref 0–10)
WBC, UA: NONE SEEN /hpf (ref 0–5)

## 2019-01-30 ENCOUNTER — Other Ambulatory Visit: Payer: Self-pay | Admitting: Internal Medicine

## 2019-01-30 DIAGNOSIS — R3121 Asymptomatic microscopic hematuria: Secondary | ICD-10-CM

## 2019-02-21 ENCOUNTER — Other Ambulatory Visit: Payer: Self-pay

## 2019-02-21 ENCOUNTER — Ambulatory Visit
Admission: RE | Admit: 2019-02-21 | Discharge: 2019-02-21 | Disposition: A | Discharge status: Home / Self Care | Payer: Medicare Other | Source: Ambulatory Visit | Attending: Internal Medicine | Admitting: Internal Medicine

## 2019-02-21 ENCOUNTER — Other Ambulatory Visit: Payer: Managed Care, Other (non HMO)

## 2019-02-21 DIAGNOSIS — R3121 Asymptomatic microscopic hematuria: Secondary | ICD-10-CM

## 2019-02-21 MED ORDER — IOHEXOL 300 MG/ML  SOLN
125.0000 mL | Freq: Once | INTRAMUSCULAR | Status: AC | PRN
  Administered 2019-02-21: 09:00:00 125 mL via INTRAVENOUS

## 2019-05-04 NOTE — Progress Notes (Signed)
11/04/2018  9:57 AM   Tracey Mosley 07/20/1951 [id number]  Referring provider: Leotis ShamesSingh, Jasmine, MD [address] [facility] [place] ,  [place] [id number]  Chief Complaint  Patient presents with  . Hematuria    HPI: Tracey BuffaloJanet B Cedotal is a 68 y.o. female with a history of hematuria, left nephrolithiasis and urgency presents today for follow up.    History of hematuria (high risk) Non smoker.  Stage 3 CKD.  CT abdomen pelvis w/o contrast on 05/2017 which showed 6 mm LLP nonobstructive stone.   There was also incidental bladder wall thickening.  Cystoscopy in 01/2018 was NED.   Her PCP ordered a CTU in 01/2019 revealed the adrenal glands appear normal. Stone within the inferior pole of left kidney measures 5 mm, image 32/2. No right renal calculi. No hydronephrosis identified bilaterally. No kidney mass. Urinary bladder appears within normal limits.  She is denies any gross hematuria.  Her UA is positive today for 3-10 RBC's.    Left nephrolithiasis CT abdomen pelvis w/o contrast on 05/2017 which showed 6 mm LLP nonobstructive stone.   She is s/p ESWL x 2 (~2013 and 2015) in the past for stones previously followed by Dr. Evelene CroonWolff.  She underwent left ESWL on 09/16/2017 with Dr. Apolinar JunesBrandon.  Her KUB taken on 09/30/2017 demonstrates calcifications projected over the upper and lower pole of the left kidney similar to previous study.  Her KUB taken on 10/14/2017 demonstrated kidneys partially obscured by overlying bowel gas is stool. At least 1 persistent calculus in lower pole of left kidney measuring approximately 5 mm.  KUB on 11/04/2018 revealed a persistent lower pole kidney stone in the left kidney measuring 5 mm.   KUB on 05/05/2019 revealed the known lower pole stone on the left appears to remain but is subtle on this study measuring approximately 4 mm.  She states that she still feels a "sticking" sensation from time to time in the left flank.  The sensation is not bothersome.    Urgency  She has minor urge incontinence in the morning.  This is not bothersome to her.    PMH: Past Medical History:  Diagnosis Date  . Hyperlipidemia   . Hypertension   . Kidney stones   . Osteoporosis   . Thyroid disease     Surgical History: Past Surgical History:  Procedure Laterality Date  . CHOLECYSTECTOMY    . EXTRACORPOREAL SHOCK WAVE LITHOTRIPSY Left 09/09/2017   Procedure: EXTRACORPOREAL SHOCK WAVE LITHOTRIPSY (ESWL);  Surgeon: Riki AltesStoioff, Scott C, MD;  Location: [facility] ORS;  Service: Urology;  Laterality: Left;  . EXTRACORPOREAL SHOCK WAVE LITHOTRIPSY Left 09/16/2017   Procedure: EXTRACORPOREAL SHOCK WAVE LITHOTRIPSY (ESWL);  Surgeon: Vanna ScotlandBrandon, Ashley, MD;  Location: [facility] ORS;  Service: Urology;  Laterality: Left;  . LITHOTRIPSY      Home Medications:  Allergies as of 05/05/2019      Reactions   Codeine Sulfate Nausea Only   Epinephrine Other (See Comments)   Tremors   Sulfa Antibiotics Nausea And Vomiting      Medication List       Accurate as of May 05, 2019  9:57 AM. If you have any questions, ask your nurse or doctor.        escitalopram 20 MG tablet Commonly known as: LEXAPRO TAKE 1 TABLET BY MOUTH ONCE DAILY   fluticasone 50 MCG/ACT nasal spray Commonly known as: FLONASE Place 1 spray into both nostrils daily.   hydrochlorothiazide 25 MG tablet Commonly known as: HYDRODIURIL  TAKE 1 TABLET BY MOUTH  DAILY   levothyroxine 50 MCG tablet Commonly known as: SYNTHROID Take 50 mcg by mouth daily before breakfast.   lisinopril 40 MG tablet Commonly known as: ZESTRIL Take 40 mg by mouth daily.   Multi-Vitamins Tabs Take by mouth.   NIFEdipine 30 MG 24 hr tablet Commonly known as: PROCARDIA-XL/NIFEDICAL-XL Take 1 tablet (30 mg total) by mouth daily.   simvastatin 20 MG tablet Commonly known as: ZOCOR TAKE 1 TABLET BY MOUTH  NIGHTLY       Allergies:  Allergies  Allergen Reactions  . Codeine Sulfate Nausea Only  . Epinephrine Other (See Comments)     Tremors  . Sulfa Antibiotics Nausea And Vomiting    Family History: Family History  Problem Relation Age of Onset  . Cancer Brother        Abdomina  . Heart disease Mother        leaky valves  . Heart attack Father [id number]  . Aneurysm Father   . Breast cancer Neg Hx     Social History:  reports that she has never smoked. She has never used smokeless tobacco. She reports current alcohol use of about 2.0 standard drinks of alcohol per week. She reports that she does not use drugs.  ROS: UROLOGY Frequent Urination?: No Hard to postpone urination?: No Burning/pain with urination?: No Get up at night to urinate?: No Leakage of urine?: No Urine stream starts and stops?: No Trouble starting stream?: No Do you have to strain to urinate?: No Blood in urine?: No Urinary tract infection?: No Sexually transmitted disease?: No Injury to kidneys or bladder?: No Painful intercourse?: No Weak stream?: No Currently pregnant?: No Vaginal bleeding?: No Last menstrual period?: n  Gastrointestinal Nausea?: No Vomiting?: No Indigestion/heartburn?: No Diarrhea?: No Constipation?: No  Constitutional Fever: No Night sweats?: No Weight loss?: No Fatigue?: No  Skin Skin rash/lesions?: No Itching?: No  Eyes Blurred vision?: No Double vision?: No  Ears/Nose/Throat Sore throat?: No Sinus problems?: Yes  Hematologic/Lymphatic Swollen glands?: No Easy bruising?: No  Cardiovascular Leg swelling?: No Chest pain?: No  Respiratory Cough?: No Shortness of breath?: No  Endocrine Excessive thirst?: No  Musculoskeletal Back pain?: No Joint pain?: No  Neurological Headaches?: No Dizziness?: No  Psychologic Depression?: No Anxiety?: Yes  Physical Exam: BP (!) 145/86 (BP Location: Left Arm, Patient Position: Sitting, Cuff Size: Normal)   Pulse (!) 17   Ht 5\' 6"  (1.676 m)   Wt 192 lb (87.1 kg)   BMI 30.99 kg/m   Constitutional:  Well nourished. Alert and oriented,  No acute distress. HEENT: [place] AT, moist mucus membranes.  Trachea midline, no masses. Cardiovascular: No clubbing, cyanosis, or edema. Respiratory: Normal respiratory effort, no increased work of breathing. Neurologic: Grossly intact, no focal deficits, moving all 4 extremities. Psychiatric: Normal mood and affect.   Laboratory Data: Urinalysis 3-10 RBC's.  See Epic. I have reviewed the labs.  Pertinent Imaging: CLINICAL DATA:  Right-sided flank pain. Hematuria. Known kidney stone.  EXAM: ABDOMEN - 1 VIEW  COMPARISON:  CT scan Feb 21, 2019.  KUB November 04, 2018.  FINDINGS: There is suggestion of a subtle stone in the lower pole the left kidney, correlating with the CT scan from Feb 21, 2019. No other renal stones are identified. No ureteral stones are noted. No other abnormalities.  IMPRESSION: The known lower pole stone on the left appears to remain but is subtle on this study measuring approximately 4 mm.   Electronically Signed  By: Dorise Bullion III M.D   On: 05/05/2019 15:09  I have independently reviewed the films and appreciate the left lower pole stone.    Assessment & Plan:    1. Nephrolithiasis Patient's stone is still visible on KUB Discussed with patient that URS would be her best chance of ridding herself of the stone - she would like to defer any procedure at this time  2. History of hematuria Hematuria work up completed in 01/2018 - findings positive for left renal stone No report of gross hematuria  UA today 3-10 RBC's - refer to nephrology for further evaluation of her AHM RTC in 6 months for UA - patient to report any gross hematuria in the interim    3. Urge incontinence  Not bothersome to the patient.    Return for pending KUB results .  Zara Council, PA-C  [facility] [address], [place], [place] [address] [phone]

## 2019-05-05 ENCOUNTER — Ambulatory Visit (INDEPENDENT_AMBULATORY_CARE_PROVIDER_SITE_OTHER): Payer: Medicare Other | Admitting: Urology

## 2019-05-05 ENCOUNTER — Encounter: Payer: Self-pay | Admitting: Urology

## 2019-05-05 ENCOUNTER — Other Ambulatory Visit: Payer: Self-pay

## 2019-05-05 ENCOUNTER — Ambulatory Visit
Admission: RE | Admit: 2019-05-05 | Discharge: 2019-05-05 | Disposition: A | Discharge status: Home / Self Care | Payer: Medicare Other | Attending: Urology | Admitting: Urology

## 2019-05-05 ENCOUNTER — Ambulatory Visit
Admission: RE | Admit: 2019-05-05 | Discharge: 2019-05-05 | Disposition: A | Discharge status: Home / Self Care | Payer: Medicare Other | Source: Ambulatory Visit | Attending: Urology | Admitting: Urology

## 2019-05-05 VITALS — BP 145/86 | HR 17 | Ht 66.0 in | Wt 192.0 lb

## 2019-05-05 DIAGNOSIS — Z87448 Personal history of other diseases of urinary system: Secondary | ICD-10-CM

## 2019-05-05 DIAGNOSIS — N200 Calculus of kidney: Secondary | ICD-10-CM

## 2019-05-05 DIAGNOSIS — N3941 Urge incontinence: Secondary | ICD-10-CM

## 2019-05-05 LAB — MICROSCOPIC EXAMINATION: Bacteria, UA: NONE SEEN

## 2019-05-05 LAB — URINALYSIS, COMPLETE
Bilirubin, UA: NEGATIVE
Glucose, UA: NEGATIVE
Ketones, UA: NEGATIVE
Leukocytes,UA: NEGATIVE
Nitrite, UA: NEGATIVE
Specific Gravity, UA: 1.02 (ref 1.005–1.030)
Urobilinogen, Ur: 2 mg/dL — ABNORMAL HIGH (ref 0.2–1.0)
pH, UA: 7 (ref 5.0–7.5)

## 2019-05-05 LAB — BLADDER SCAN AMB NON-IMAGING: Scan Result: 2

## 2019-07-03 ENCOUNTER — Ambulatory Visit: Payer: Managed Care, Other (non HMO) | Admitting: Internal Medicine

## 2019-07-03 NOTE — Progress Notes (Deleted)
Follow-up Outpatient Visit Date: 07/03/2019  Primary Care Provider: Patrice Paradise, MD [address] [facility] [address] [address] [facility] [place] [address]  Chief Complaint: ***  HPI:  [name] is a 68 y.o. year-old female with history of hypertension, hyperlipidemia, chronic kidney disease stage III, hypothyroidism, and obesity, who presents for follow-up of hypertension.  I last saw her in 03/2018, which time she was doing well other than occasional ankle edema in setting of the summer heat as well as occasional brief palpitations.  We did not make any medication changes at that time, as blood pressure was well controlled  --------------------------------------------------------------------------------------------------  Cardiovascular History & Procedures: Cardiovascular Problems:  Uncontrolled hypertension  Risk Factors:  Hypertension, hyperlipidemia, and obesity  Cath/PCI:  None.  CV Surgery:  None.  EP Procedures and Devices:  None.  Non-Invasive Evaluation(s):  Pharmacologic MPI (06/08/17): Low risk study without ischemia or scar. Normal LVEF.  TTE (05/18/17): Normal LV size and function. LVEF 55-60%. Normal RV size and function. No significant valvular abnormalities.  Recent CV Pertinent Labs: Lab Results  Component Value Date   K 4.2 05/06/2017   K 4.5 03/17/2014   BUN 22 (H) 05/06/2017   BUN 22 (H) 03/17/2014   CREATININE 1.02 (H) 05/06/2017   CREATININE 0.84 03/17/2014    Past medical and surgical history were reviewed and updated in EPIC.  No outpatient medications have been marked as taking for the 07/03/19 encounter (Appointment) with Adar Rase, Cristal Deer, MD.    Allergies: Codeine sulfate, Epinephrine, and Sulfa antibiotics  Social History   Tobacco Use  . Smoking status: Never Smoker  . Smokeless tobacco: Never Used  Substance Use Topics  . Alcohol use: Yes    Alcohol/week: 2.0 standard drinks    Types: 2 Cans of beer per week   Comment: once a month  . Drug use: No    Family History  Problem Relation Age of Onset  . Cancer Brother        Abdomina  . Heart disease Mother        leaky valves  . Heart attack Father 32  . Aneurysm Father   . Breast cancer Neg Hx     Review of Systems: A 12-system review of systems was performed and was negative except as noted in the HPI.  --------------------------------------------------------------------------------------------------  Physical Exam: There were no vitals taken for this visit.  General:  *** HEENT: No conjunctival pallor or scleral icterus. Moist mucous membranes.  OP clear. Neck: Supple without lymphadenopathy, thyromegaly, JVD, or HJR. No carotid bruit. Lungs: Normal work of breathing. Clear to auscultation bilaterally without wheezes or crackles. Heart: Regular rate and rhythm without murmurs, rubs, or gallops. Non-displaced PMI. Abd: Bowel sounds present. Soft, NT/ND without hepatosplenomegaly Ext: No lower extremity edema. Radial, PT, and DP pulses are 2+ bilaterally. Skin: Warm and dry without rash.  EKG:  ***  Lab Results  Component Value Date   WBC 9.1 03/17/2014   HGB 14.1 03/17/2014   HCT 42.0 03/17/2014   MCV 94 03/17/2014   PLT 206 03/17/2014    Lab Results  Component Value Date   NA 141 05/06/2017   K 4.2 05/06/2017   CL 106 05/06/2017   CO2 25 05/06/2017   BUN 22 (H) 05/06/2017   CREATININE 1.02 (H) 05/06/2017   GLUCOSE 100 (H) 05/06/2017   ALT 25 05/06/2017    No results found for: CHOL, HDL, LDLCALC, LDLDIRECT, TRIG, CHOLHDL  --------------------------------------------------------------------------------------------------  ASSESSMENT AND PLAN: ***  [name], MD  07/03/2019 7:32 AM

## 2019-07-10 DIAGNOSIS — R3129 Other microscopic hematuria: Secondary | ICD-10-CM | POA: Insufficient documentation

## 2019-07-10 NOTE — Progress Notes (Signed)
Follow-up Outpatient Visit Date: 07/12/2019  Primary Care Provider: Marinda Elk, [place] [address]  Chief Complaint: Follow-up hypertension  HPI:  Tracey Mosley is a 68 y.o. year-old female with history of hypertension, hyperlipidemia, chronic kidney disease stage III, hypothyroidism, and obesity, who presents for follow-up of hypertension.  I last saw Ms. Wien in 03/2018, at which time she was feeling well other than occasional ankle edema associated with summer heat.  She also reported occasional brief palpitations without associated symptoms.  Blood pressure was well controlled at that time.  We did not make any medication changes.  Today, Ms. Vasseur reports that she has been feeling well.  She notes 2 episodes of "cramping" in the chest (right of the sternum) lasting 5 minutes or less.  The discomfort is random without associated symptoms.  She does not have any exertional chest pain nor shortness of breath, palpitations, or lightheadedness.  She has not had a significant lower extremity edema either.  She reports home blood pressures have been 120-140/70-90.  She recently had some tenderness in her calves when being examined for edema by Dr. Candiss Norse.  This seemed new for her.  She has been undergoing work-up of hematuria.  She recently underwent lithotripsy but notes that the stones are still present.  --------------------------------------------------------------------------------------------------  Cardiovascular History & Procedures: Cardiovascular Problems:  Hypertension  Risk Factors:  Hypertension, hyperlipidemia, and obesity  Cath/PCI:  None.  CV Surgery:  None.  EP Procedures and Devices:  None.  Non-Invasive Evaluation(s):  Pharmacologic MPI (06/08/17): Low risk study without ischemia or scar. Normal LVEF.  TTE (05/18/17): Normal LV size and function. LVEF 55-60%. Normal RV size and function. No  significant valvular abnormalities.  Recent CV Pertinent Labs: Lab Results  Component Value Date   K 4.2 05/06/2017   K 4.5 03/17/2014   BUN 22 (H) 05/06/2017   BUN 22 (H) 03/17/2014   CREATININE 1.02 (H) 05/06/2017   CREATININE 0.84 03/17/2014    Past medical and surgical history were reviewed and updated in EPIC.  Current Meds  Medication Sig   Cyanocobalamin 1000 MCG/ML KIT Inject as directed every 30 (thirty) days.   escitalopram (LEXAPRO) 20 MG tablet TAKE 1 TABLET BY MOUTH ONCE DAILY   fluticasone (FLONASE) 50 MCG/ACT nasal spray Place 1 spray into both nostrils daily.    hydrochlorothiazide (HYDRODIURIL) 25 MG tablet TAKE 1 TABLET BY MOUTH  DAILY   levothyroxine (SYNTHROID, LEVOTHROID) 50 MCG tablet Take 50 mcg by mouth daily before breakfast.    lisinopril (PRINIVIL,ZESTRIL) 40 MG tablet Take 40 mg by mouth daily.   Multiple Vitamin (MULTI-VITAMINS) TABS Take by mouth.   NIFEdipine (PROCARDIA-XL/NIFEDICAL-XL) 30 MG 24 hr tablet Take 1 tablet (30 mg total) by mouth daily.   simvastatin (ZOCOR) 20 MG tablet TAKE 1 TABLET BY MOUTH  NIGHTLY    Allergies: Codeine, Codeine sulfate, Epinephrine, and Sulfa antibiotics  Social History   Tobacco Use   Smoking status: Never Smoker   Smokeless tobacco: Never Used  Substance Use Topics   Alcohol use: Yes    Alcohol/week: 2.0 standard drinks    Types: 2 Cans of beer per week    Comment: once a month   Drug use: No    Family History  Problem Relation Age of Onset   Cancer Brother        Abdomina   Heart disease Mother        leaky valves   Heart attack  Father 9   Aneurysm Father    Breast cancer Neg Hx     Review of Systems: A 12-system review of systems was performed and was negative except as noted in the HPI.  --------------------------------------------------------------------------------------------------  Physical Exam: BP 130/80 (BP Location: Left Arm, Patient Position: Sitting, Cuff  Size: Normal)    Pulse 78    Ht '5\' 6"'  (1.676 m)    Wt 194 lb 12 oz (88.3 kg)    BMI 31.43 kg/m   General: NAD. HEENT: No conjunctival pallor or scleral icterus.  Facemask in place. Neck: Supple without lymphadenopathy, thyromegaly, JVD, or HJR. Lungs: Normal work of breathing. Clear to auscultation bilaterally without wheezes or crackles. Heart: Regular rate and rhythm without murmurs, rubs, or gallops. Non-displaced PMI. Abd: Bowel sounds present. Soft, NT/ND without hepatosplenomegaly Ext: No lower extremity edema. Radial, PT, and DP pulses are 2+ bilaterally. Skin: Warm and dry without rash.  EKG: Normal sinus rhythm with inferior Q waves.  No significant change from prior tracing on 04/27/2018.  Lab Results  Component Value Date   WBC 9.1 03/17/2014   HGB 14.1 03/17/2014   HCT 42.0 03/17/2014   MCV 94 03/17/2014   PLT 206 03/17/2014    Lab Results  Component Value Date   NA 141 05/06/2017   K 4.2 05/06/2017   CL 106 05/06/2017   CO2 25 05/06/2017   BUN 22 (H) 05/06/2017   CREATININE 1.02 (H) 05/06/2017   GLUCOSE 100 (H) 05/06/2017   ALT 25 05/06/2017    No results found for: CHOL, HDL, LDLCALC, LDLDIRECT, TRIG, CHOLHDL  --------------------------------------------------------------------------------------------------  ASSESSMENT AND PLAN: Hypertension: Blood pressure reasonably well controlled.  No medication changes at this time.  Labs are being monitored by Dr. Candiss Norse.  Chest pain: Episodes of been rare and self-limited.  Symptoms are not consistent with angina.  Myocardial perfusion stress test in 2018 was low risk without ischemia or scar.  No further work-up or intervention at this time.  Influenza immunization: Seasonal flu vaccine administered at the patient's request.  Follow-up: Return to clinic in 1 year.  Nelva Bush, MD 07/12/2019 1:52 PM

## 2019-07-12 ENCOUNTER — Other Ambulatory Visit: Payer: Self-pay

## 2019-07-12 ENCOUNTER — Encounter: Payer: Self-pay | Admitting: Internal Medicine

## 2019-07-12 ENCOUNTER — Ambulatory Visit (INDEPENDENT_AMBULATORY_CARE_PROVIDER_SITE_OTHER): Payer: Medicare Other | Admitting: Internal Medicine

## 2019-07-12 VITALS — BP 130/80 | HR 78 | Ht 66.0 in | Wt 194.8 lb

## 2019-07-12 DIAGNOSIS — I10 Essential (primary) hypertension: Secondary | ICD-10-CM | POA: Diagnosis not present

## 2019-07-12 DIAGNOSIS — R079 Chest pain, unspecified: Secondary | ICD-10-CM | POA: Diagnosis not present

## 2019-07-12 DIAGNOSIS — Z23 Encounter for immunization: Secondary | ICD-10-CM | POA: Diagnosis not present

## 2019-07-12 NOTE — Patient Instructions (Signed)
Medication Instructions:  Your physician recommends that you continue on your current medications as directed. Please refer to the Current Medication list given to you today.  If you need a refill on your cardiac medications before your next appointment, please call your pharmacy.   Lab work: NONE If you have labs (blood work) drawn today and your tests are completely normal, you will receive your results only by: . MyChart Message (if you have MyChart) OR . A paper copy in the mail If you have any lab test that is abnormal or we need to change your treatment, we will call you to review the results.  Testing/Procedures: NONE  Follow-Up: At [facility], you and your health needs are our priority.  As part of our continuing mission to provide you with exceptional heart care, we have created designated Provider Care Teams.  These Care Teams include your primary Cardiologist (physician) and Advanced Practice Providers (APPs -  Physician Assistants and Nurse Practitioners) who all work together to provide you with the care you need, when you need it. You will need a follow up appointment in 12 months.  Please call our office 2 months in advance to schedule this appointment.  You may see DR CHRISTOPHER END or one of the following Advanced Practice Providers on your designated Care Team:   Christopher Berge, NP Ryan Dunn, PA-C . Jacquelyn Visser, PA-C     

## 2019-08-15 ENCOUNTER — Other Ambulatory Visit: Payer: Self-pay | Admitting: Internal Medicine

## 2019-08-30 ENCOUNTER — Telehealth: Payer: Self-pay | Admitting: Urology

## 2019-08-30 NOTE — Telephone Encounter (Signed)
Please have Tracey Mosley schedule an appointment in May for her follow up for hematuria.

## 2019-08-31 NOTE — Telephone Encounter (Signed)
Appt made and mailed

## 2019-09-05 ENCOUNTER — Other Ambulatory Visit: Payer: Self-pay | Admitting: Physician Assistant

## 2019-09-05 DIAGNOSIS — Z1231 Encounter for screening mammogram for malignant neoplasm of breast: Secondary | ICD-10-CM

## 2019-09-08 ENCOUNTER — Ambulatory Visit
Admission: RE | Admit: 2019-09-08 | Discharge: 2019-09-08 | Disposition: A | Discharge status: Home / Self Care | Payer: Medicare Other | Source: Ambulatory Visit | Attending: Physician Assistant | Admitting: Physician Assistant

## 2019-09-08 DIAGNOSIS — Z1231 Encounter for screening mammogram for malignant neoplasm of breast: Secondary | ICD-10-CM | POA: Insufficient documentation

## 2019-10-10 IMAGING — MG 2D DIGITAL SCREENING BILATERAL MAMMOGRAM WITH CAD AND ADJUNCT TO
8 of 14 series · 8 of 30 positions shown · non-contrast
Comparison: Previous exam(s).

CLINICAL DATA: Screening.

EXAM:
2D DIGITAL SCREENING BILATERAL MAMMOGRAM WITH CAD AND ADJUNCT TOMO

[R MLO (1 of 2)]
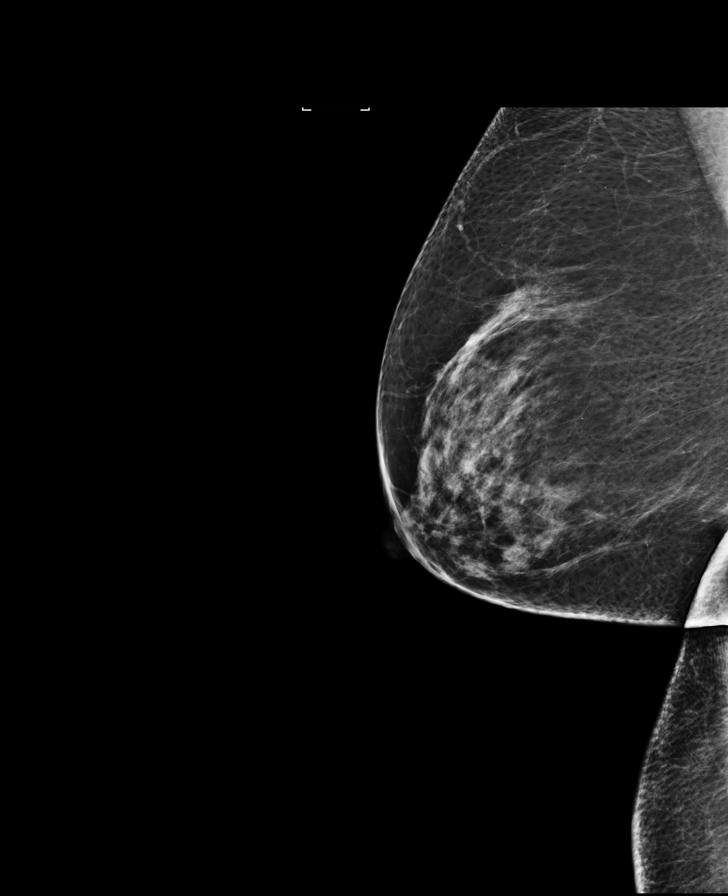

[L MLO (1 of 2)]
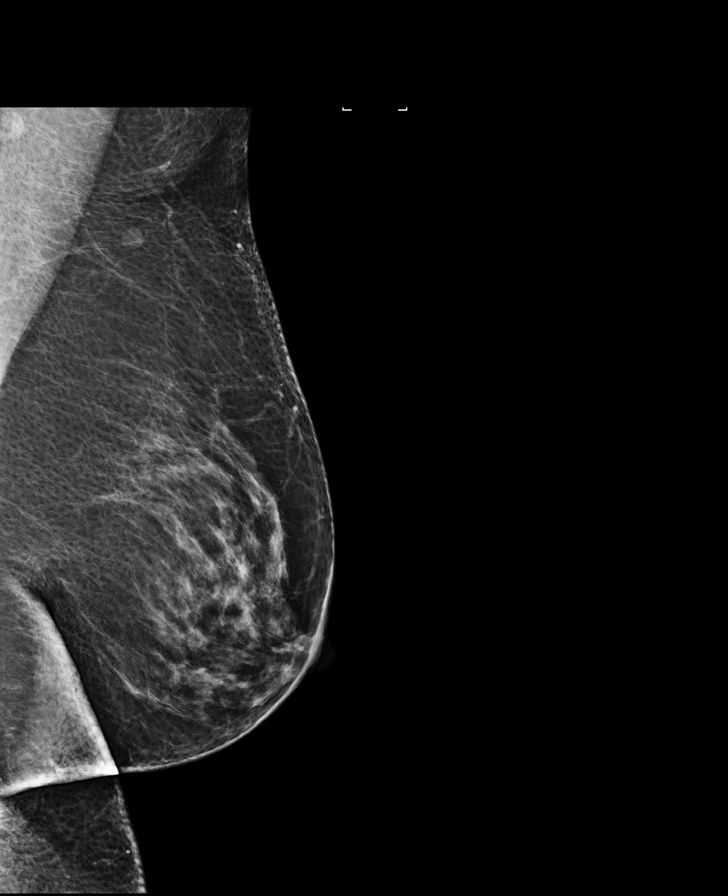

[R CC synth-2D]
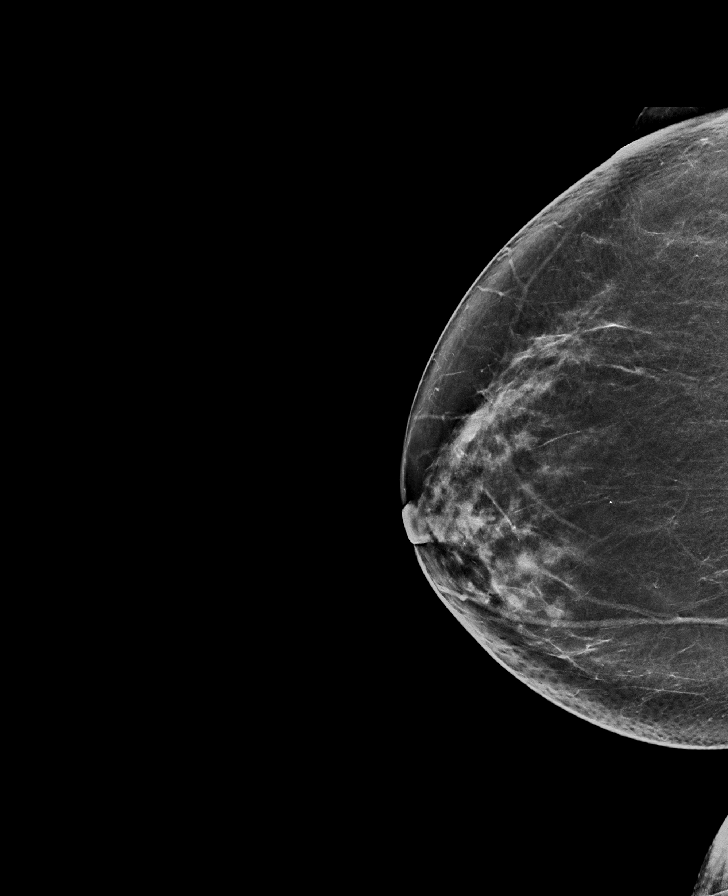

[L MLO (2 of 2)]
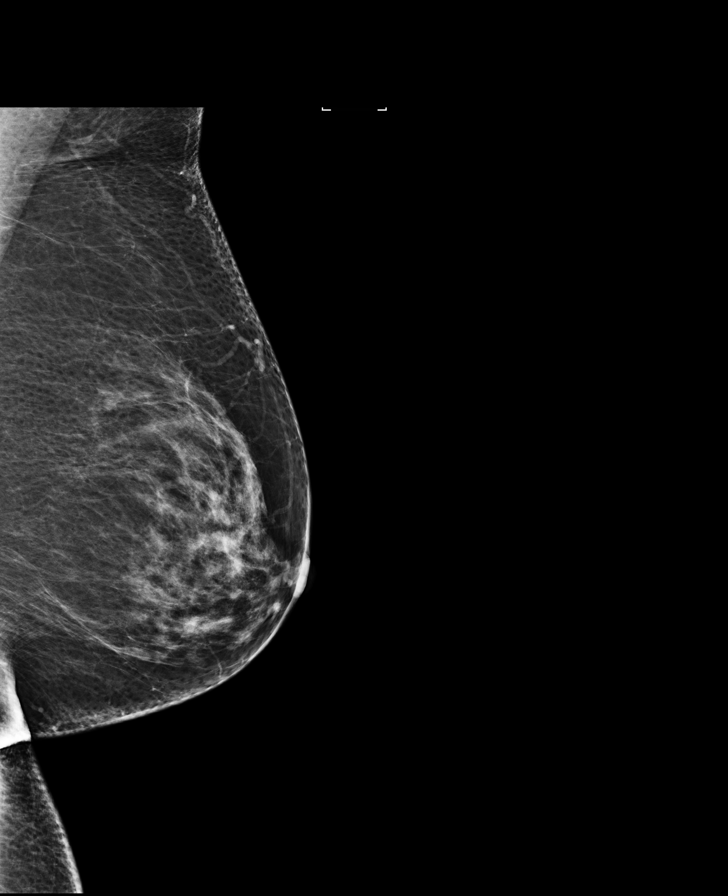

[R CC]
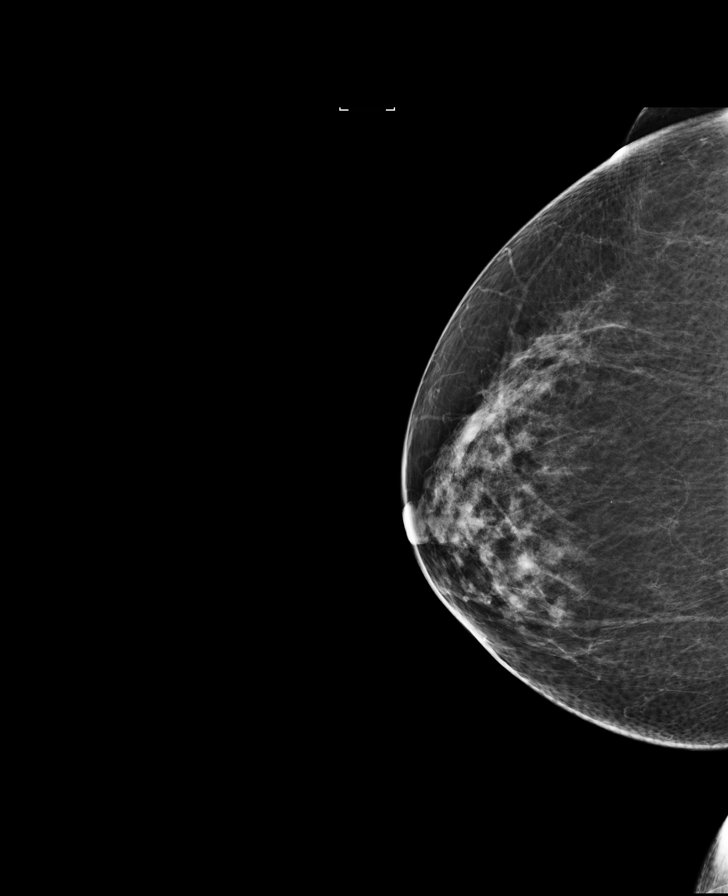

[L MLO synth-2D]
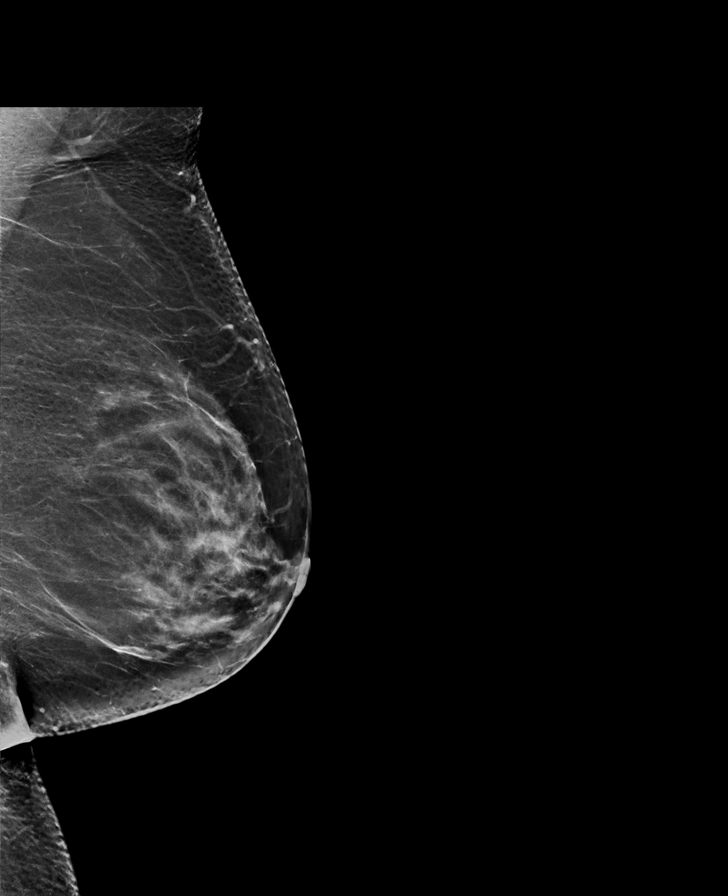

[L CC]
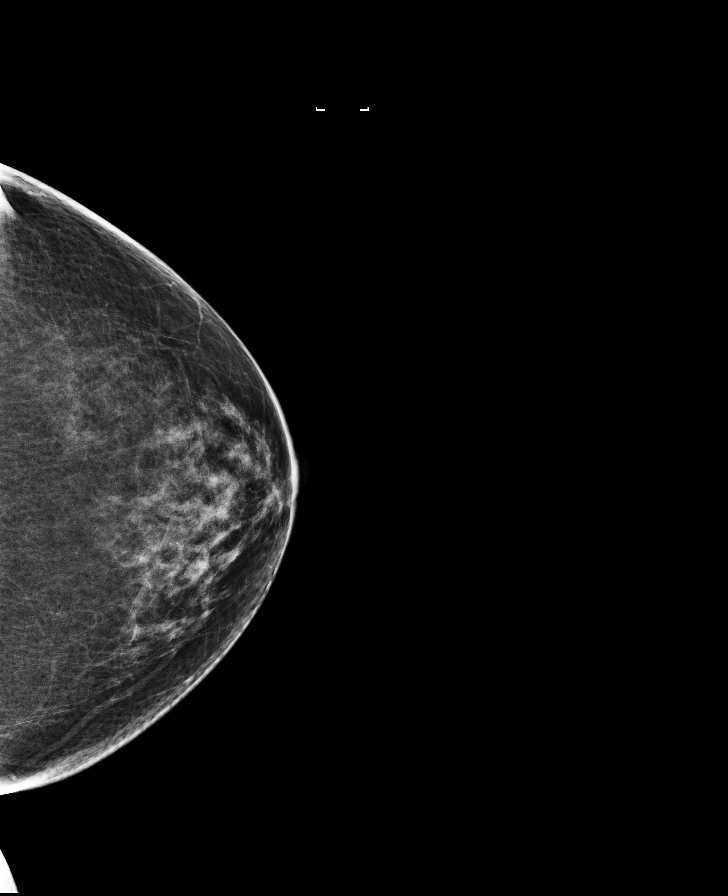

[R MLO (2 of 2)]
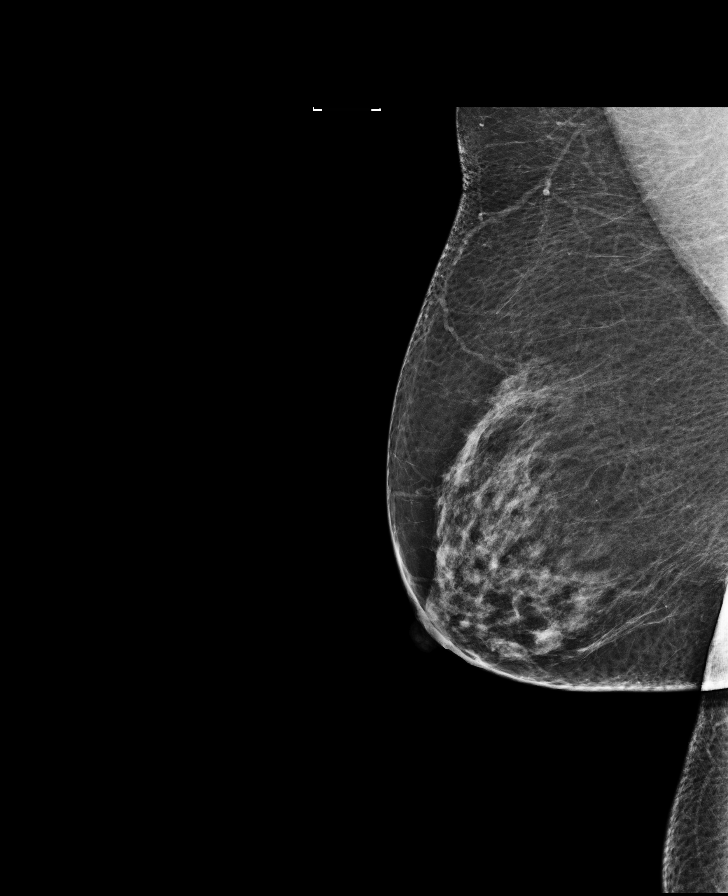

[8 of 30 positions shown; findings below may reference images not displayed]

ACR Breast Density Category b: There are scattered areas of
fibroglandular density.
FINDINGS: There are no findings suspicious for malignancy. Images were
processed with CAD.
IMPRESSION: No mammographic evidence of malignancy. A result letter of this
screening mammogram will be mailed directly to the patient.

RECOMMENDATION:
Screening mammogram in one year. (Code:97-6-RS4)

BI-RADS CATEGORY  1: Negative.

## 2019-10-23 IMAGING — CR DG ABDOMEN 1V
1 series · 2 of 2 positions shown · non-contrast
Comparison: E 06/07/2017.

CLINICAL DATA: Left renal stone .

EXAM:
ABDOMEN - 1 VIEW

[Series 1: dg abd 1 view · 0.14mm/px · 2 of 2 slices shown]
[im 1/2]
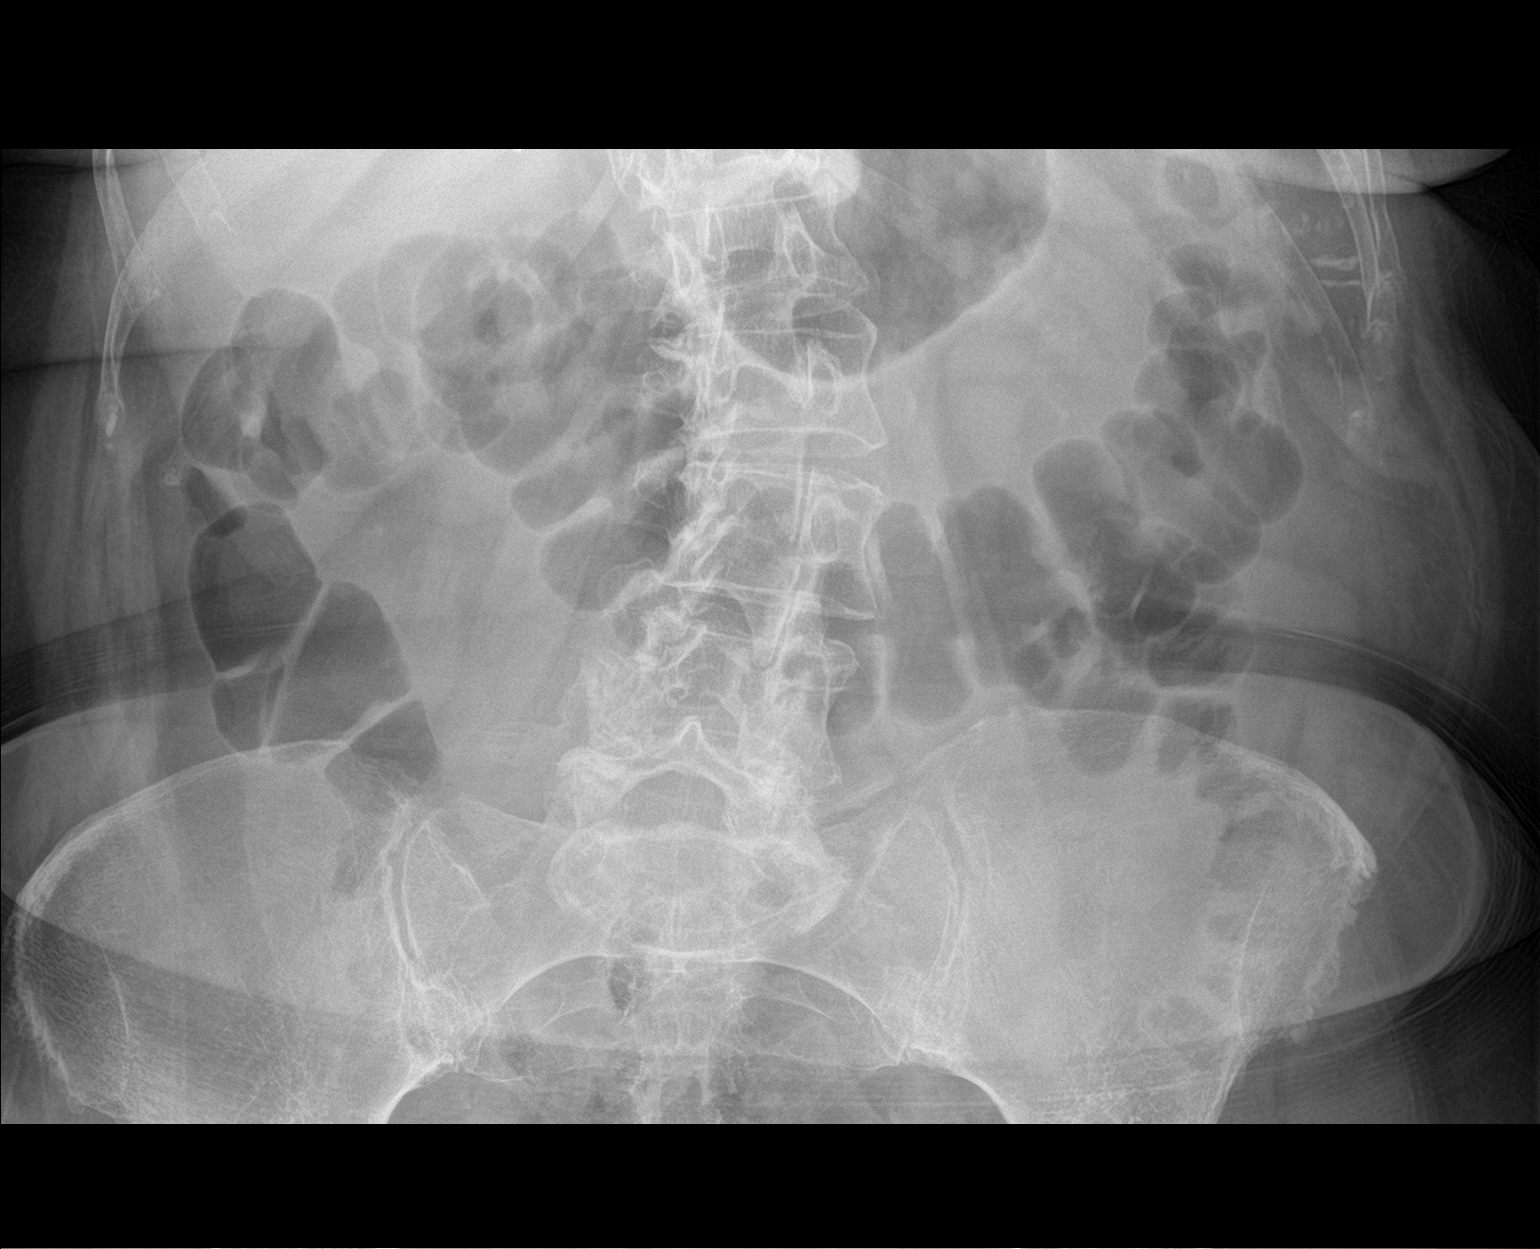
[im 2/2]
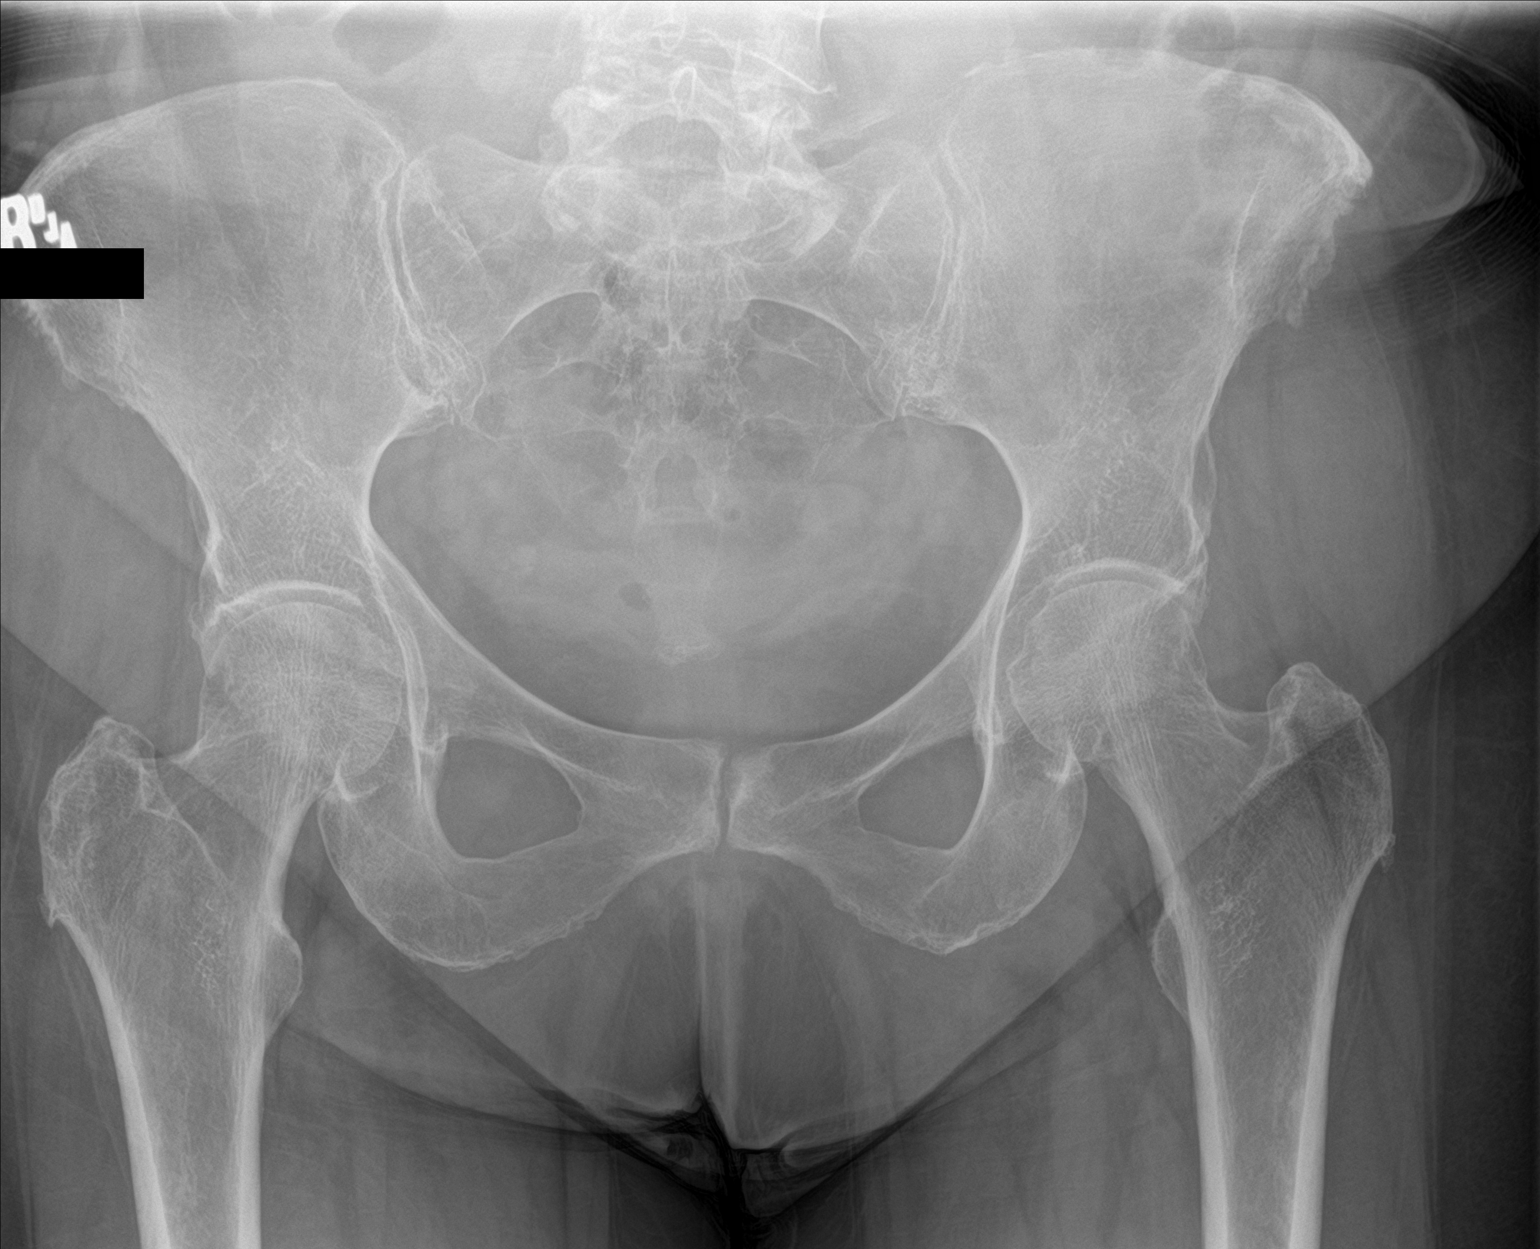

[2 of 2 positions shown; findings below may reference images not displayed]

FINDINGS: Soft tissues scratched it surgical clips right upper quadrant. Tiny
left lower renal pole stone again noted. No evidence of ureteral
stone. No bowel distention. Degenerative changes scoliosis lumbar
spine. Degenerative changes both hips. Diffuse osteopenia.
IMPRESSION: Tiny left lower pole renal stone again noted without interim change.
No evidence of ureteral stone. No acute abnormality .

## 2020-01-30 ENCOUNTER — Ambulatory Visit (INDEPENDENT_AMBULATORY_CARE_PROVIDER_SITE_OTHER): Payer: Medicare Other | Admitting: Urology

## 2020-01-30 ENCOUNTER — Ambulatory Visit
Admission: RE | Admit: 2020-01-30 | Discharge: 2020-01-30 | Disposition: A | Discharge status: Home / Self Care | Payer: Medicare Other | Source: Ambulatory Visit | Attending: Urology | Admitting: Urology

## 2020-01-30 ENCOUNTER — Other Ambulatory Visit: Payer: Self-pay | Admitting: Family Medicine

## 2020-01-30 ENCOUNTER — Encounter: Payer: Self-pay | Admitting: Urology

## 2020-01-30 ENCOUNTER — Other Ambulatory Visit: Payer: Self-pay

## 2020-01-30 VITALS — BP 136/84 | HR 80 | Ht 66.0 in | Wt 198.0 lb

## 2020-01-30 DIAGNOSIS — N200 Calculus of kidney: Secondary | ICD-10-CM | POA: Insufficient documentation

## 2020-01-30 DIAGNOSIS — R82998 Other abnormal findings in urine: Secondary | ICD-10-CM

## 2020-01-30 DIAGNOSIS — Z87448 Personal history of other diseases of urinary system: Secondary | ICD-10-CM | POA: Diagnosis not present

## 2020-01-30 LAB — MICROSCOPIC EXAMINATION

## 2020-01-30 LAB — URINALYSIS, COMPLETE
Bilirubin, UA: NEGATIVE
Glucose, UA: NEGATIVE
Ketones, UA: NEGATIVE
Nitrite, UA: NEGATIVE
Protein,UA: NEGATIVE
Specific Gravity, UA: 1.025 (ref 1.005–1.030)
Urobilinogen, Ur: 0.2 mg/dL (ref 0.2–1.0)
pH, UA: 6.5 (ref 5.0–7.5)

## 2020-01-30 NOTE — Progress Notes (Signed)
11/04/2018  11:32 AM   Tracey Mosley 05-Mar-1951 [id number]  Referring provider: Marinda Elk, MD [facility] [place],  [place] [address]  Chief Complaint  Patient presents with  . Nephrolithiasis    HPI: Tracey Mosley is a 69 y.o. female with a history of hematuria, left nephrolithiasis and urgency presents today for follow up.    History of hematuria (high risk) Non smoker.  Stage 3 CKD.  CT abdomen pelvis w/o contrast on 05/2017 which showed 6 mm LLP nonobstructive stone.   There was also incidental bladder wall thickening.  Cystoscopy in 01/2018 was NED.   Her PCP ordered a CTU in 01/2019 revealed the adrenal glands appear normal. Stone within the inferior pole of left kidney measures 5 mm, image 32/2. No right renal calculi. No hydronephrosis identified bilaterally. No kidney mass. Urinary bladder appears within normal limits.  She does not report gross hematuria.  Her UA today negative for micro heme.   Left nephrolithiasis CT abdomen pelvis w/o contrast on 05/2017 which showed 6 mm LLP nonobstructive stone.   She is s/p ESWL x 2 (~2013 and 2015) in the past for stones previously followed by Dr. Yves Dill.  She underwent left ESWL on 09/16/2017 with Dr. Erlene Quan.  Her KUB taken on 09/30/2017 demonstrates calcifications projected over the upper and lower pole of the left kidney similar to previous study.  Her KUB taken on 10/14/2017 demonstrated kidneys partially obscured by overlying bowel gas is stool. At least 1 persistent calculus in lower pole of left kidney measuring approximately 5 mm.  KUB on 11/04/2018 revealed a persistent lower pole kidney stone in the left kidney measuring 5 mm.   KUB on 05/05/2019 revealed the known lower pole stone on the left appears to remain but is subtle on this study measuring approximately 4 mm.   KUB 01/30/2020 shows the left intrarenal stone.    PMH: Past Medical History:  Diagnosis Date  . Hyperlipidemia   .  Hypertension   . Kidney stones   . Osteoporosis   . Thyroid disease     Surgical History: Past Surgical History:  Procedure Laterality Date  . CHOLECYSTECTOMY    . EXTRACORPOREAL SHOCK WAVE LITHOTRIPSY Left 09/09/2017   Procedure: EXTRACORPOREAL SHOCK WAVE LITHOTRIPSY (ESWL);  Surgeon: Abbie Sons, MD;  Location: [facility] ORS;  Service: Urology;  Laterality: Left;  . EXTRACORPOREAL SHOCK WAVE LITHOTRIPSY Left 09/16/2017   Procedure: EXTRACORPOREAL SHOCK WAVE LITHOTRIPSY (ESWL);  Surgeon: Hollice Espy, MD;  Location: [facility] ORS;  Service: Urology;  Laterality: Left;  . LITHOTRIPSY      Home Medications:  Allergies as of 01/30/2020      Reactions   Codeine Nausea Only   Codeine Sulfate Nausea Only   Epinephrine Other (See Comments)   Tremors   Sulfa Antibiotics Nausea And Vomiting      Medication List       Accurate as of Jan 30, 2020 11:59 PM. If you have any questions, ask your nurse or doctor.        Cyanocobalamin 1000 MCG/ML Kit Inject as directed every 30 (thirty) days.   escitalopram 20 MG tablet Commonly known as: LEXAPRO TAKE 1 TABLET BY MOUTH ONCE DAILY   fluticasone 50 MCG/ACT nasal spray Commonly known as: FLONASE Place 1 spray into both nostrils daily.   hydrochlorothiazide 25 MG tablet Commonly known as: HYDRODIURIL TAKE 1 TABLET BY MOUTH  DAILY   levothyroxine 50 MCG tablet Commonly known as: SYNTHROID Take  50 mcg by mouth daily before breakfast.   lisinopril 40 MG tablet Commonly known as: ZESTRIL Take 40 mg by mouth daily.   meloxicam 7.5 MG tablet Commonly known as: MOBIC Take by mouth.   Multi-Vitamins Tabs Take by mouth.   NIFEdipine 30 MG 24 hr tablet Commonly known as: PROCARDIA-XL/NIFEDICAL-XL TAKE 1 TABLET(30 MG) BY MOUTH DAILY   simvastatin 20 MG tablet Commonly known as: ZOCOR TAKE 1 TABLET BY MOUTH  NIGHTLY       Allergies:  Allergies  Allergen Reactions  . Codeine Nausea Only  . Codeine Sulfate Nausea Only  .  Epinephrine Other (See Comments)    Tremors  . Sulfa Antibiotics Nausea And Vomiting    Family History: Family History  Problem Relation Age of Onset  . Cancer Brother        Abdomina  . Heart disease Mother        leaky valves  . Heart attack Father 36  . Aneurysm Father   . Breast cancer Neg Hx     Social History:  reports that she has never smoked. She has never used smokeless tobacco. She reports current alcohol use of about 2.0 standard drinks of alcohol per week. She reports that she does not use drugs.  ROS: For pertinent review of systems please refer to history of present illness  Physical Exam: BP 136/84   Pulse 80   Ht _0  (1.676 m)   Wt 198 lb (89.8 kg)   BMI 31.96 kg/m   Constitutional:  Well nourished. Alert and oriented, No acute distress. HEENT: [place] AT, mask in place.  Trachea midline. Cardiovascular: No clubbing, cyanosis, or edema. Respiratory: Normal respiratory effort, no increased work of breathing. Neurologic: Grossly intact, no focal deficits, moving all 4 extremities. Psychiatric: Normal mood and affect.   Laboratory Data: Urinalysis Component     Latest Ref Rng & Units 01/30/2020  Specific Gravity, UA     1.005 - 1.030 1.025  pH, UA     5.0 - 7.5 6.5  Color, UA     Yellow Yellow  Appearance Ur     Clear Clear  Leukocytes,UA     Negative Trace (A)  Protein,UA     Negative/Trace Negative  Glucose, UA     Negative Negative  Ketones, UA     Negative Negative  RBC, UA     Negative 2+ (A)  Bilirubin, UA     Negative Negative  Urobilinogen, Ur     0.2 - 1.0 mg/dL 0.2  Nitrite, UA     Negative Negative  Microscopic Examination      See below:   Component     Latest Ref Rng & Units 01/30/2020  WBC, UA     0 - 5 /hpf 0-5  RBC     0 - 2 /hpf 0-2  Epithelial Cells (non renal)     0 - 10 /hpf 0-10  Casts     None seen /lpf Present (A)  Cast Type     N/A White cell casts (A)  Bacteria, UA     None seen/Few Moderate (A)   I have  reviewed the labs.  Pertinent Imaging: CLINICAL DATA:  History of kidney stones. Chronic pain for 4 years to the right side. History of lithotripsy.  EXAM: ABDOMEN - 1 VIEW  COMPARISON:  Prior exams with radiographs dated 11/04/2018 and CT dated 02/21/2019.  FINDINGS: Small stone projecting in the lower pole of the left kidney is stable.  No other intrarenal stones.  There is a small calcification suggested over the peripheral margin of the left L3 spinous process. This may reflect superimposed shadows, could reflect a left ureteral stone if there are consistent clinical findings. No other evidence of a ureteral stone.  Normal bowel gas pattern. Status post cholecystectomy, stable. Soft tissues otherwise unremarkable.  Skeletal structures are demineralized. There is a scoliosis of the visualized spine, stable.  IMPRESSION: 1. Possible small left ureteral stone versus superimposed shadows as detailed above. 2. Stable left intrarenal stone. No evidence of a new intrarenal stone.   Electronically Signed   By: Lajean Manes M.D.   On: 01/30/2020 11:23 I have independently reviewed the films and note the left lower pole stone.      Assessment & Plan:    1. Left lower pole stone Stone stable on today's KUB RTC in one year for KUB  2. History of hematuria Hematuria work up completed in 01/2018 - findings positive for left renal stone No report of gross hematuria  UA today no microheme RTC in 6 months for UA - patient to report any gross hematuria in the interim    3. White blood cells casts  Seen on today's UA, associated with moderate bacteria - patient will provide another specimen for culture Followed by nephrology for CKD     Return in about 1 year (around 01/29/2021) for KUB and UA .  Zara Council, PA-C  [facility] [address] [place], [place] [address] [phone]

## 2020-02-28 ENCOUNTER — Telehealth: Payer: Self-pay | Admitting: Urology

## 2020-02-28 DIAGNOSIS — R8271 Bacteriuria: Secondary | ICD-10-CM

## 2020-02-28 NOTE — Telephone Encounter (Signed)
I have spoken with Tracey Mosley regarding her urine specimen which contained WBC casts and moderate bacteria.  Although she is asymptomatic at this time, I asked if she would supply another urine for UA and urine culture.  [place] and [place] home this weekend.  She will provide the specimen on Monday. I have placed the orders for UA and urine culture.

## 2020-02-28 NOTE — Telephone Encounter (Signed)
Patient notified and voiced understanding. Appointment has been scheduled. 

## 2020-03-04 ENCOUNTER — Other Ambulatory Visit: Payer: Medicare Other

## 2020-03-04 ENCOUNTER — Other Ambulatory Visit: Payer: Self-pay

## 2020-03-04 DIAGNOSIS — R8271 Bacteriuria: Secondary | ICD-10-CM

## 2020-03-04 LAB — URINALYSIS, COMPLETE
Bilirubin, UA: NEGATIVE
Glucose, UA: NEGATIVE
Leukocytes,UA: NEGATIVE
Nitrite, UA: NEGATIVE
Specific Gravity, UA: 1.025 (ref 1.005–1.030)
Urobilinogen, Ur: 1 mg/dL (ref 0.2–1.0)
pH, UA: 5.5 (ref 5.0–7.5)

## 2020-03-04 LAB — MICROSCOPIC EXAMINATION: Bacteria, UA: NONE SEEN

## 2020-03-07 ENCOUNTER — Telehealth: Payer: Self-pay | Admitting: Family Medicine

## 2020-03-07 DIAGNOSIS — N200 Calculus of kidney: Secondary | ICD-10-CM

## 2020-03-07 LAB — CULTURE, URINE COMPREHENSIVE

## 2020-03-07 NOTE — Telephone Encounter (Signed)
Patient notified and voiced understanding. KUB order in.

## 2020-03-07 NOTE — Telephone Encounter (Signed)
-----   Message from Harle Battiest, PA-C sent at 03/07/2020 12:12 PM EDT ----- Please let Tracey Mosley know that her urine had microscopic blood, but her urine culture was negative for infection.  I would for her to get another xray in the next two weeks to see if her stone is on the move.

## 2020-03-24 ENCOUNTER — Telehealth: Payer: Self-pay | Admitting: Urology

## 2020-03-24 NOTE — Telephone Encounter (Signed)
Please have Tracey Mosley come in this week to have her KUB.

## 2020-03-25 NOTE — Telephone Encounter (Signed)
Patient notified and will get xray. She has an appointment also to discuss the left side as well.

## 2020-03-26 ENCOUNTER — Ambulatory Visit
Admission: RE | Admit: 2020-03-26 | Discharge: 2020-03-26 | Disposition: A | Discharge status: Home / Self Care | Payer: Medicare Other | Source: Ambulatory Visit | Attending: Urology | Admitting: Urology

## 2020-03-26 ENCOUNTER — Ambulatory Visit
Admission: RE | Admit: 2020-03-26 | Discharge: 2020-03-26 | Disposition: A | Discharge status: Home / Self Care | Payer: Medicare Other | Attending: Urology | Admitting: Urology

## 2020-03-26 DIAGNOSIS — N200 Calculus of kidney: Secondary | ICD-10-CM | POA: Insufficient documentation

## 2020-03-26 NOTE — Progress Notes (Signed)
11/04/2018  10:49 AM   Tracey Mosley 16-Sep-1951 [id number]  Referring provider: Marinda Elk, MD [place] [facility]  [place] [address]  Chief Complaint  Patient presents with  . Nephrolithiasis    HPI: Tracey Mosley is a 69 y.o. female with a history of hematuria, left nephrolithiasis and urgency presents today for follow up.    History of hematuria (high risk) Non smoker.  Stage 3 CKD.  CT abdomen pelvis w/o contrast on 05/2017 which showed 6 mm LLP nonobstructive stone.   There was also incidental bladder wall thickening.  Cystoscopy in 01/2018 was NED.   Her PCP ordered a CTU in 01/2019 revealed the adrenal glands appear normal. Stone within the inferior pole of left kidney measures 5 mm, image 32/2. No right renal calculi. No hydronephrosis identified bilaterally. No kidney mass. Urinary bladder appears within normal limits.  Today's UA positive for 3-10 RBC's.    Left nephrolithiasis CT abdomen pelvis w/o contrast on 05/2017 which showed 6 mm LLP nonobstructive stone.   She is s/p ESWL x 2 (~2013 and 2015) in the past for stones previously followed by Dr. Yves Dill.  She underwent left ESWL on 09/16/2017 with Dr. Erlene Quan.  Her KUB taken on 09/30/2017 demonstrates calcifications projected over the upper and lower pole of the left kidney similar to previous study.  Her KUB taken on 10/14/2017 demonstrated kidneys partially obscured by overlying bowel gas is stool. At least 1 persistent calculus in lower pole of left kidney measuring approximately 5 mm.  KUB on 11/04/2018 revealed a persistent lower pole kidney stone in the left kidney measuring 5 mm.   KUB on 05/05/2019 revealed the known lower pole stone on the left appears to remain but is subtle on this study measuring approximately 4 mm.   KUB 01/30/2020 shows the left intrarenal stone and possible left mid ureteral stone.  KUB 03/26/2020 stable left renal stone, previous calculus in the vicinity of L3 on  the left is no longer visualized     PMH: Past Medical History:  Diagnosis Date  . Hyperlipidemia   . Hypertension   . Kidney stones   . Osteoporosis   . Thyroid disease     Surgical History: Past Surgical History:  Procedure Laterality Date  . CHOLECYSTECTOMY    . EXTRACORPOREAL SHOCK WAVE LITHOTRIPSY Left 09/09/2017   Procedure: EXTRACORPOREAL SHOCK WAVE LITHOTRIPSY (ESWL);  Surgeon: Abbie Sons, MD;  Location: [facility] ORS;  Service: Urology;  Laterality: Left;  . EXTRACORPOREAL SHOCK WAVE LITHOTRIPSY Left 09/16/2017   Procedure: EXTRACORPOREAL SHOCK WAVE LITHOTRIPSY (ESWL);  Surgeon: Hollice Espy, MD;  Location: [facility] ORS;  Service: Urology;  Laterality: Left;  . LITHOTRIPSY      Home Medications:  Allergies as of 03/27/2020      Reactions   Codeine Nausea Only   Codeine Sulfate Nausea Only   Epinephrine Other (See Comments)   Tremors   Sulfa Antibiotics Nausea And Vomiting      Medication List       Accurate as of March 27, 2020 10:49 AM. If you have any questions, ask your nurse or doctor.        Cyanocobalamin 1000 MCG/ML Kit Inject as directed every 30 (thirty) days.   escitalopram 20 MG tablet Commonly known as: LEXAPRO TAKE 1 TABLET BY MOUTH ONCE DAILY   fluticasone 50 MCG/ACT nasal spray Commonly known as: FLONASE Place 1 spray into both nostrils daily.   hydrochlorothiazide 25 MG tablet Commonly  known as: HYDRODIURIL TAKE 1 TABLET BY MOUTH  DAILY   levothyroxine 50 MCG tablet Commonly known as: SYNTHROID Take 50 mcg by mouth daily before breakfast.   lisinopril 40 MG tablet Commonly known as: ZESTRIL Take 40 mg by mouth daily.   meloxicam 7.5 MG tablet Commonly known as: MOBIC Take by mouth.   Multi-Vitamins Tabs Take by mouth.   NIFEdipine 30 MG 24 hr tablet Commonly known as: PROCARDIA-XL/NIFEDICAL-XL TAKE 1 TABLET(30 MG) BY MOUTH DAILY   simvastatin 20 MG tablet Commonly known as: ZOCOR TAKE 1 TABLET BY MOUTH  NIGHTLY        Allergies:  Allergies  Allergen Reactions  . Codeine Nausea Only  . Codeine Sulfate Nausea Only  . Epinephrine Other (See Comments)    Tremors  . Sulfa Antibiotics Nausea And Vomiting    Family History: Family History  Problem Relation Age of Onset  . Cancer Brother        Abdomina  . Heart disease Mother        leaky valves  . Heart attack Father 32  . Aneurysm Father   . Breast cancer Neg Hx     Social History:  reports that she has never smoked. She has never used smokeless tobacco. She reports current alcohol use of about 2.0 standard drinks of alcohol per week. She reports that she does not use drugs.  ROS: For pertinent review of systems please refer to history of present illness  Physical Exam: BP 118/75   Pulse 84   Ht '5\' 6"'  (1.676 m)   Wt 198 lb (89.8 kg)   BMI 31.96 kg/m   Constitutional:  Well nourished. Alert and oriented, No acute distress. HEENT: [place] AT, mask in place.  Trachea midline Cardiovascular: No clubbing, cyanosis, or edema. Respiratory: Normal respiratory effort, no increased work of breathing. Neurologic: Grossly intact, no focal deficits, moving all 4 extremities. Psychiatric: Normal mood and affect.   Laboratory Data: Urinalysis Component     Latest Ref Rng & Units 03/27/2020  Specific Gravity, UA     1.005 - 1.030 1.020  pH, UA     5.0 - 7.5 5.5  Color, UA     Yellow Yellow  Appearance Ur     Clear Hazy (A)  Leukocytes,UA     Negative Trace (A)  Protein,UA     Negative/Trace 1+ (A)  Glucose, UA     Negative Negative  Ketones, UA     Negative Trace (A)  RBC, UA     Negative 2+ (A)  Bilirubin, UA     Negative Negative  Urobilinogen, Ur     0.2 - 1.0 mg/dL 1.0  Nitrite, UA     Negative Negative  Microscopic Examination      See below:   Component     Latest Ref Rng & Units 03/27/2020  WBC, UA     0 - 5 /hpf 0-5  RBC     0 - 2 /hpf 3-10 (A)  Epithelial Cells (non renal)     0 - 10 /hpf 0-10  Bacteria, UA      None seen/Few None seen   I have reviewed the labs.  Pertinent Imaging: CLINICAL DATA:  History of kidney stones. Chronic pain for 4 years to the right side. History of lithotripsy.  EXAM: ABDOMEN - 1 VIEW  COMPARISON:  Prior exams with radiographs dated 11/04/2018 and CT dated 02/21/2019.  FINDINGS: Small stone projecting in the lower pole of the left kidney  is stable. No other intrarenal stones.  There is a small calcification suggested over the peripheral margin of the left L3 spinous process. This may reflect superimposed shadows, could reflect a left ureteral stone if there are consistent clinical findings. No other evidence of a ureteral stone.  Normal bowel gas pattern. Status post cholecystectomy, stable. Soft tissues otherwise unremarkable.  Skeletal structures are demineralized. There is a scoliosis of the visualized spine, stable.  IMPRESSION: 1. Possible small left ureteral stone versus superimposed shadows as detailed above. 2. Stable left intrarenal stone. No evidence of a new intrarenal stone.   Electronically Signed   By: Lajean Manes M.D.   On: 01/30/2020 11:23  CLINICAL DATA:  RIGHT-sided kidney stones  EXAM: ABDOMEN - 1 VIEW  COMPARISON:  Plain film 01/30/2020  FINDINGS: 5 mm calculus lower pole of the LEFT kidney. No RIGHT renal calculi identified. Bowel gas does overlie the renal shadows.  No ureteral calculi identified.  Postcholecystectomy.  IMPRESSION: LEFT nephrolithiasis unchanged.   Electronically Signed   By: Suzy Bouchard M.D.   On: 03/26/2020 16:10 I have independently reviewed the films.  See HPI.     Assessment & Plan:    1. Left lower pole stone Stone stable on today's KUB RTC in one year for KUB  2. History of hematuria Hematuria work up completed in 01/2018 - findings positive for left renal stone No report of gross hematuria  UA today 3-10 RBC's  RTC in 12 months for UA - patient to  report any gross hematuria in the interim    3. White blood cells casts  Resolved Given a list of nephrotoxic medications so that she can avoid them when she can Followed by nephrology for CKD    Return for keep follow up appointment in May .  Zara Council, PA-C  [facility] [address], [place], [place] [address] [phone]

## 2020-03-27 ENCOUNTER — Encounter: Payer: Self-pay | Admitting: Urology

## 2020-03-27 ENCOUNTER — Ambulatory Visit (INDEPENDENT_AMBULATORY_CARE_PROVIDER_SITE_OTHER): Payer: Medicare Other | Admitting: Urology

## 2020-03-27 ENCOUNTER — Other Ambulatory Visit: Payer: Self-pay

## 2020-03-27 VITALS — BP 118/75 | HR 84 | Ht 66.0 in | Wt 198.0 lb

## 2020-03-27 DIAGNOSIS — Z87448 Personal history of other diseases of urinary system: Secondary | ICD-10-CM | POA: Diagnosis not present

## 2020-03-27 DIAGNOSIS — N200 Calculus of kidney: Secondary | ICD-10-CM

## 2020-03-28 LAB — URINALYSIS, COMPLETE
Bilirubin, UA: NEGATIVE
Glucose, UA: NEGATIVE
Nitrite, UA: NEGATIVE
Specific Gravity, UA: 1.02 (ref 1.005–1.030)
Urobilinogen, Ur: 1 mg/dL (ref 0.2–1.0)
pH, UA: 5.5 (ref 5.0–7.5)

## 2020-03-28 LAB — MICROSCOPIC EXAMINATION: Bacteria, UA: NONE SEEN

## 2020-05-07 ENCOUNTER — Other Ambulatory Visit: Payer: Self-pay | Admitting: Internal Medicine

## 2020-05-09 ENCOUNTER — Other Ambulatory Visit: Payer: Self-pay | Admitting: Internal Medicine

## 2020-07-10 ENCOUNTER — Other Ambulatory Visit: Payer: Self-pay

## 2020-07-10 ENCOUNTER — Encounter: Payer: Self-pay | Admitting: Internal Medicine

## 2020-07-10 ENCOUNTER — Ambulatory Visit (INDEPENDENT_AMBULATORY_CARE_PROVIDER_SITE_OTHER): Payer: Medicare Other | Admitting: Internal Medicine

## 2020-07-10 VITALS — BP 148/82 | HR 75 | Ht 66.0 in | Wt 196.0 lb

## 2020-07-10 DIAGNOSIS — R9431 Abnormal electrocardiogram [ECG] [EKG]: Secondary | ICD-10-CM | POA: Diagnosis not present

## 2020-07-10 DIAGNOSIS — I10 Essential (primary) hypertension: Secondary | ICD-10-CM | POA: Diagnosis not present

## 2020-07-10 DIAGNOSIS — Z23 Encounter for immunization: Secondary | ICD-10-CM

## 2020-07-10 DIAGNOSIS — Z79899 Other long term (current) drug therapy: Secondary | ICD-10-CM

## 2020-07-10 MED ORDER — VALSARTAN 160 MG PO TABS: 160.0000 mg | ORAL_TABLET | Freq: Every day | ORAL | 1 refills | Status: DC

## 2020-07-10 MED ORDER — AMLODIPINE BESYLATE 5 MG PO TABS: 5.0000 mg | ORAL_TABLET | Freq: Every day | ORAL | 1 refills | Status: DC

## 2020-07-10 NOTE — Progress Notes (Signed)
Follow-up Outpatient Visit Date: 07/10/2020  Primary Care Provider: Marinda Mosley, [place] [address]  Chief Complaint: Vertigo  HPI:  [name] is a 69 y.o. female with history of hypertension, hyperlipidemia, chronic kidney disease stage III, hypothyroidism, and obesity, who presents for follow-up of hypertension.  I last saw her a year ago, at which time [name] was feeling relatively well.  She noted two episodes of "cramping" in the right side of her chest lasting less than 5 minutes.  Pain was nonexertional.  Given atypical nature and normal stress test in 2018, further testing was deferred.  Today, [name] reports that she has felt dizzy over the last 2 weeks.  She feel like the room spins when she lies down.  She also feels lightheaded at times when she sits up.  She wonders if allergies could be contributing, which has prompted her to start using an OTC antihistamine.  She denies chest pain and shortness of breath.  Over the last few weeks, she has experienced sporadic palpitations, though these are not clearly associated with the aforementioned dizziness.  She is trying to stay well-hydrated.  She denies edema.  Home BP yesterday was 135/75 with a HR of 95.  --------------------------------------------------------------------------------------------------  Cardiovascular History & Procedures: Cardiovascular Problems:  Hypertension  Risk Factors:  Hypertension, hyperlipidemia, and obesity  Cath/PCI:  None.  CV Surgery:  None.  EP Procedures and Devices:  None.  Non-Invasive Evaluation(s):  Pharmacologic MPI (06/08/17): Low risk study without ischemia or scar. Normal LVEF.  TTE (05/18/17): Normal LV size and function. LVEF 55-60%. Normal RV size and function. No significant valvular abnormalities.  Recent CV Pertinent Labs: Lab Results  Component Value Date   K 4.2 05/06/2017   K 4.5 03/17/2014     BUN 22 (H) 05/06/2017   BUN 22 (H) 03/17/2014   CREATININE 1.02 (H) 05/06/2017   CREATININE 0.84 03/17/2014    Past medical and surgical history were reviewed and updated in EPIC.  Current Meds  Medication Sig  . Cyanocobalamin 1000 MCG/ML KIT Inject as directed every 30 (thirty) days.  Marland Kitchen escitalopram (LEXAPRO) 20 MG tablet TAKE 1 TABLET BY MOUTH ONCE DAILY  . fluticasone (FLONASE) 50 MCG/ACT nasal spray Place 1 spray into both nostrils daily.   . hydrochlorothiazide (HYDRODIURIL) 25 MG tablet TAKE 1 TABLET BY MOUTH  DAILY  . levothyroxine (SYNTHROID, LEVOTHROID) 50 MCG tablet Take 50 mcg by mouth daily before breakfast.   . lisinopril (PRINIVIL,ZESTRIL) 40 MG tablet Take 40 mg by mouth daily.  . meloxicam (MOBIC) 7.5 MG tablet Take 7.5 mg by mouth daily as needed.   . Multiple Vitamin (MULTI-VITAMINS) TABS Take by mouth.  Marland Kitchen NIFEdipine (PROCARDIA-XL/NIFEDICAL-XL) 30 MG 24 hr tablet Take 1 tablet (30 mg total) by mouth daily. Please schedule office visit for further refills. Thank you!  . simvastatin (ZOCOR) 20 MG tablet TAKE 1 TABLET BY MOUTH  NIGHTLY    Allergies: Codeine, Codeine sulfate, Epinephrine, and Sulfa antibiotics  Social History   Tobacco Use  . Smoking status: Never Smoker  . Smokeless tobacco: Never Used  Vaping Use  . Vaping Use: Never used  Substance Use Topics  . Alcohol use: Yes    Alcohol/week: 2.0 standard drinks    Types: 2 Cans of beer per week    Comment: once a month  . Drug use: No    Family History  Problem Relation Age of Onset  . Cancer Brother  Abdomina  . Heart disease Mother        leaky valves  . Heart attack Father 24  . Aneurysm Father   . Breast cancer Neg Hx     Review of Systems: [name] notes well energy as well as intermittent cough.  Otherwise, a 12-system review of systems was performed and was negative except as noted in the  HPI.  --------------------------------------------------------------------------------------------------  Physical Exam: BP (!) 148/82 (BP Location: Left Arm, Patient Position: Sitting, Cuff Size: Normal)   Pulse 75   Ht _0  (1.676 m)   Wt 196 lb (88.9 kg)   SpO2 97%   BMI 31.64 kg/m   General:  NAD. Neck: No JVD or HJR. Lungs: CTA bilaterally. Heart: RRR w/o mr/rg. Abd: Soft, NT/ND. Ext: No LE edema.  EKG:  NSR with inferior Q waves and poor R wave progression.  No significant change since 07/12/2019.  Lab Results  Component Value Date   WBC 9.1 03/17/2014   HGB 14.1 03/17/2014   HCT 42.0 03/17/2014   MCV 94 03/17/2014   PLT 206 03/17/2014    Lab Results  Component Value Date   NA 141 05/06/2017   K 4.2 05/06/2017   CL 106 05/06/2017   CO2 25 05/06/2017   BUN 22 (H) 05/06/2017   CREATININE 1.02 (H) 05/06/2017   GLUCOSE 100 (H) 05/06/2017   ALT 25 05/06/2017    Lipid panel (01/17/2020): Total cholesterol 181, triglycerides 129, HDL 45, LDL 110  TSH (04/03/2020): 4.581  --------------------------------------------------------------------------------------------------  ASSESSMENT AND PLAN: Hypertension: Blood pressure is mildly elevated today but typically better at home and at other office visits this year.  [name] is concerned about her copay with nifedipine.  I also wonder if some of her chronic cough could be related to lisinopril, though she has been on this medication for years.  We have agreed to stop nifedipine and lisinopril and add valsartan 160 mg daily and amlodipine 5 mg daily.  We will plan to check a BMP in ~2 weeks and have [name] follow-up in 3 months to assess her response.  Abnormal EKG: EKG again shows NSR with inferior Q waves and poor R wave progression, which is unchanged going back as far as 06/2014.  Myocardial perfusion stress test in 2018 was normal without ischemia or scar.  Given lack of chest pain and shortness of breath, we  will defer additional testing.  Influenza immunization: Seasonal flu vaccine administered at the patient's request.  Follow-up: Return to clinic in 3 months.  Tracey Bush, MD 07/10/2020 11:01 AM

## 2020-07-10 NOTE — Patient Instructions (Signed)
Medication Instructions:  Your physician has recommended you make the following change in your medication:  1- STOP Lisinopril. 2- STOP Nifedipine. 3- START Valsartan 160 mg by mouth once a day. 4- START Amlodipine 5 mg by mouth once a day.  *If you need a refill on your cardiac medications before your next appointment, please call your pharmacy*  Lab Work: Your physician recommends that you return for lab work in: 2 weeks  ~ 07/24/20 at the [facility] for BMET. - Please go to the [facility]. You will check in at the front desk to the right as you walk into the atrium. Valet Parking is offered if needed. - No appointment needed. You may go any day between 7 am and 6 pm.  If you have labs (blood work) drawn today and your tests are completely normal, you will receive your results only by: Marland Kitchen MyChart Message (if you have MyChart) OR . A paper copy in the mail If you have any lab test that is abnormal or we need to change your treatment, we will call you to review the results.  Testing/Procedures: none  Follow-Up: At [facility], you and your health needs are our priority.  As part of our continuing mission to provide you with exceptional heart care, we have created designated Provider Care Teams.  These Care Teams include your primary Cardiologist (physician) and Advanced Practice Providers (APPs -  Physician Assistants and Nurse Practitioners) who all work together to provide you with the care you need, when you need it.  We recommend signing up for the patient portal called "MyChart".  Sign up information is provided on this After Visit Summary.  MyChart is used to connect with patients for Virtual Visits (Telemedicine).  Patients are able to view lab/test results, encounter notes, upcoming appointments, etc.  Non-urgent messages can be sent to your provider as well.   To learn more about what you can do with MyChart, go to [email or web address].    Your next appointment:    3 month(s)  The format for your next appointment:   In Person  Provider:   You may see DR Cristal Deer END or one of the following Advanced Practice Providers on your designated Care Team:    Nicolasa Ducking, NP  Eula Listen, PA-C  Marisue Ivan, PA-C  [place], [place]

## 2020-07-12 ENCOUNTER — Encounter: Payer: Self-pay | Admitting: Internal Medicine

## 2020-07-29 ENCOUNTER — Telehealth: Payer: Self-pay | Admitting: Internal Medicine

## 2020-07-29 NOTE — Telephone Encounter (Signed)
Dr End, Patient was due for BMET follow up for med adjustments made at office visit with you on 07/10/20. CMET results from [facility] are in [facility] for you to review.

## 2020-07-29 NOTE — Telephone Encounter (Signed)
Patient calling in to state she had labs and a urine sample done today at [facility]  Which included the labs Dr. Okey Dupre requested.  Please advise

## 2020-07-30 NOTE — Telephone Encounter (Signed)
Kidney function and electrolytes look stable.  Tracey Mosley should continue her current medications and follow-up as previously arranged.  Tracey Kendall, MD [facility]

## 2020-07-31 NOTE — Telephone Encounter (Signed)
The patient has been notified of the result and verbalized understanding.  All questions (if any) were answered. Sherlynn Stalls Hadiya Spoerl, RN 07/31/2020 2:23 PM

## 2020-08-15 ENCOUNTER — Other Ambulatory Visit: Payer: Self-pay | Admitting: Physician Assistant

## 2020-08-15 DIAGNOSIS — Z1231 Encounter for screening mammogram for malignant neoplasm of breast: Secondary | ICD-10-CM

## 2020-09-24 ENCOUNTER — Ambulatory Visit
Admission: RE | Admit: 2020-09-24 | Discharge: 2020-09-24 | Disposition: A | Discharge status: Home / Self Care | Payer: Medicare Other | Source: Ambulatory Visit | Attending: Physician Assistant | Admitting: Physician Assistant

## 2020-09-24 ENCOUNTER — Other Ambulatory Visit: Payer: Self-pay

## 2020-09-24 DIAGNOSIS — Z1231 Encounter for screening mammogram for malignant neoplasm of breast: Secondary | ICD-10-CM | POA: Insufficient documentation

## 2020-09-30 ENCOUNTER — Telehealth: Payer: Self-pay | Admitting: Internal Medicine

## 2020-09-30 NOTE — Telephone Encounter (Signed)
STAT if patient feels like he/she is going to faint   1) Are you dizzy now? no  2) Do you feel faint or have you passed out? no  3) Do you have any other symptoms? Lightheaded and off balance sits on the side of the bed in morning before getting up to help   4) Have you checked your HR and BP (record if available)? Last time last week  Unknown est recall is 139/86

## 2020-09-30 NOTE — Telephone Encounter (Signed)
Spoke with patient. Her main concern is that her BP seems to be elevated at times.  She has not taken it much over the past few weeks because of the holidays. Remembers it was around 137/86 a week ago. Today, it was 167/86, heart rate not recorded. She went to the orthopedists and her BP was elevated but it is noted she was in pain. She reports she has a headache in the back of her head as well but she attributes that to stress.   She is due to refill her medications which Dr End switched her to at her last visit in October and wanted to ensure she would stay on them at the current doses before refilling. Patient is scheduled to see Brion Aliment, NP on 10/04/20. She agreed to take BP/HR over the next few days about 2 hours after morning medications. She will bring the readings and her BP cuff to her visit on Friday to compare.  She also complained of dizziness upon waking in the morning. Advised patient to take it slowly when getting up in the morning by sitting on the side of the bed, then standing and waiting a minute or so prior to walking. She verbalized understanding.  She will let us know before Friday if any symptoms worsen or develop.

## 2020-10-04 ENCOUNTER — Ambulatory Visit (INDEPENDENT_AMBULATORY_CARE_PROVIDER_SITE_OTHER): Payer: Medicare Other | Admitting: Nurse Practitioner

## 2020-10-04 ENCOUNTER — Other Ambulatory Visit: Payer: Self-pay

## 2020-10-04 ENCOUNTER — Encounter: Payer: Self-pay | Admitting: Nurse Practitioner

## 2020-10-04 VITALS — BP 130/80 | HR 73 | Ht 66.0 in | Wt 199.0 lb

## 2020-10-04 DIAGNOSIS — I951 Orthostatic hypotension: Secondary | ICD-10-CM | POA: Diagnosis not present

## 2020-10-04 DIAGNOSIS — I10 Essential (primary) hypertension: Secondary | ICD-10-CM

## 2020-10-04 DIAGNOSIS — E782 Mixed hyperlipidemia: Secondary | ICD-10-CM | POA: Diagnosis not present

## 2020-10-04 MED ORDER — VALSARTAN 160 MG PO TABS
160.0000 mg | ORAL_TABLET | Freq: Every day | ORAL | 3 refills | Status: DC
Start: 2020-10-04 — End: 2021-04-16

## 2020-10-04 MED ORDER — AMLODIPINE BESYLATE 5 MG PO TABS: 5.0000 mg | ORAL_TABLET | Freq: Every day | ORAL | 3 refills | Status: DC

## 2020-10-04 NOTE — Progress Notes (Signed)
Office Visit    Patient Name: Tracey Mosley Date of Encounter: 10/04/2020  Primary Care Provider:  Marinda Elk, MD Primary Cardiologist:  Nelva Bush, MD  Chief Complaint    70 year old female with a history of hypertension, hyperlipidemia, stage III chronic kidney disease, hypothyroidism, and obesity, who presents for follow-up of hypertension and orthostatic lightheadedness.  Past Medical History    Past Medical History:  Diagnosis Date  . Abnormal ECG    a. inferior Q's, poor R progression - dates back to 06/2014; b. 05/2017 MV: No ischemia/infarct.  . History of echocardiogram    a. 04/2017 Echo: EF 55-60%, nl RV size/fxn.  . Hyperlipidemia   . Hypertension   . Kidney stones   . Osteoporosis   . Thyroid disease    Past Surgical History:  Procedure Laterality Date  . CHOLECYSTECTOMY    . EXTRACORPOREAL SHOCK WAVE LITHOTRIPSY Left 09/09/2017   Procedure: EXTRACORPOREAL SHOCK WAVE LITHOTRIPSY (ESWL);  Surgeon: Abbie Sons, MD;  Location: [facility] ORS;  Service: Urology;  Laterality: Left;  . EXTRACORPOREAL SHOCK WAVE LITHOTRIPSY Left 09/16/2017   Procedure: EXTRACORPOREAL SHOCK WAVE LITHOTRIPSY (ESWL);  Surgeon: Hollice Espy, MD;  Location: [facility] ORS;  Service: Urology;  Laterality: Left;  . LITHOTRIPSY      Allergies  Allergies  Allergen Reactions  . Codeine Nausea Only  . Codeine Sulfate Nausea Only  . Epinephrine Other (See Comments)    Tremors  . Sulfa Antibiotics Nausea And Vomiting    History of Present Illness    70 year old female with a history of hypertension, hyperlipidemia, stage III chronic kidney disease, hypothyroidism, and obesity.  She also has a history of abnormal ECG with inferior Q waves and poor R wave progression that dates back to October 2015.  Echocardiogram in August 2018 showed normal LV and RV function.  Stress testing in September 2018 showed no ischemia or infarct.  She was last seen in cardiology clinic in October  2021, at which time she was experiencing some dizziness.  She also noted persistent cough and concerned about affording nifedipine in the future.  She was switched from lisinopril to valsartan and from nifedipine to amlodipine.  She had follow-up labs with primary care in November which showed stable renal function and electrolytes.  Since her last visit, she has mostly felt well but recently noted some elevations in blood pressures at home, sometimes into the 150s.  She is also been experiencing intermittent orthostatic lightheadedness, especially of changing positions quickly or when getting out of bed in the morning.  She has not checked her blood pressures during those episodes.  She contacted our office the other day to arrange for follow-up in light of the symptoms.  Her blood pressure today is 130/80.  She has not been having any chest pain, dyspnea, palpitations, PND, orthopnea, syncope, edema, or early satiety.  Home Medications    Prior to Admission medications   Medication Sig Start Date End Date Taking? Authorizing Provider  amLODipine (NORVASC) 5 MG tablet Take 1 tablet (5 mg total) by mouth daily. 07/10/20 10/08/20  End, Harrell Gave, MD  Cyanocobalamin 1000 MCG/ML KIT Inject as directed every 30 (thirty) days.    [provider]  escitalopram (LEXAPRO) 20 MG tablet TAKE 1 TABLET BY MOUTH ONCE DAILY 03/26/17   [provider]  fluticasone (FLONASE) 50 MCG/ACT nasal spray Place 1 spray into both nostrils daily.  03/23/17   [provider]  hydrochlorothiazide (HYDRODIURIL) 25 MG tablet TAKE 1 TABLET  BY MOUTH  DAILY 06/29/18   End, Harrell Gave, MD  levothyroxine (SYNTHROID, LEVOTHROID) 50 MCG tablet Take 50 mcg by mouth daily before breakfast.  12/22/16   [provider]  meloxicam (MOBIC) 7.5 MG tablet Take 7.5 mg by mouth daily as needed.  01/24/20 01/23/21  [provider]  Multiple Vitamin (MULTI-VITAMINS) TABS Take by mouth.    [provider]  simvastatin (ZOCOR) 20 MG tablet TAKE 1 TABLET BY MOUTH  NIGHTLY 04/05/17   [provider]  valsartan (DIOVAN) 160 MG tablet Take 1 tablet (160 mg total) by mouth daily. 07/10/20   End, Harrell Gave, MD    Review of Systems    Orthostatic lightheadedness, especially when getting out of bed in the morning.  She denies chest pain, dyspnea, palpitations, PND, orthopnea, syncope, edema, or early satiety.  All other systems reviewed and are otherwise negative except as noted above.  Physical Exam    VS:  BP 130/80 (BP Location: Left Arm, Patient Position: Sitting, Cuff Size: Normal)   Pulse 73   Ht '5\' 6"'  (1.676 m)   Wt 199 lb (90.3 kg)   SpO2 98%   BMI 32.12 kg/m  , BMI Body mass index is 32.12 kg/m.  Orthostatic VS for the past 24 hrs:  BP- Lying Pulse- Lying BP- Sitting Pulse- Sitting BP- Standing at 0 minutes Pulse- Standing at 0 minutes  10/04/20 1143 138/86 68 135/83 73 140/81 80     GEN: Obese, in no acute distress. HEENT: normal. Neck: Supple, no JVD, carotid bruits, or masses. Cardiac: RRR, no murmurs, rubs, or gallops. No clubbing, cyanosis, edema.  Radials/PT 2+ and equal bilaterally.  Respiratory:  Respirations regular and unlabored, clear to auscultation bilaterally. GI: Soft, obese, nontender, nondistended, BS + x 4. MS: no deformity or atrophy. Skin: warm and dry, no rash. Neuro:  Strength and sensation are intact. Psych: Normal affect.  Accessory Clinical Findings    ECG personally reviewed by me today -regular sinus rhythm, 73, inferior Q waves and poor R wave progression- no acute changes.  Labs dated July 29, 2020 Hemoglobin 15.5, hematocrit 46.3, WBC 8.3, platelets 277 Sodium 140, potassium 3.8, chloride 103, CO2 29, BUN 18, creatinine 1.0, glucose 111 Total protein 7.6, calcium 9.7, albumin 4.5 Total bilirubin 1.2, alkaline phosphatase 48, AST 20, ALT 16 Hemoglobin A1c 5.6 Total cholesterol 168, triglycerides 115, HDL 47.5, LDL  98  Assessment & Plan    1. Essential hypertension: Recently, she has had a few blood pressure readings at home that have been in the 150s.  Pressure is 130/80 today.  She is also had intermittent orthostatic lightheadedness, especially when getting up in the morning.  Orthostatic vital signs today show slight increase in heart rate but stable blood pressures.  We discussed options for management of recent higher pressures at home.  Given orthostatic lightheadedness my preference would be to institute lifestyle modifications including salt restriction and weight loss prior to advancing medication any further as I suspect that will only result in more orthostasis.  She was agreeable with this plan.  I refilled her amlodipine and valsartan today.  2.  Orthostatic lightheadedness: Slight elevation in heart rate on orthostatic vital signs today but blood pressures were stable.  See #1.  3.  Hyperlipidemia: She remains on simvastatin therapy with a recent LDL of 98.  4.  Chronic kidney disease: Stable BUN/creatinine on recent evaluation in November.  5.  Morbid obesity: We discussed the importance of lifestyle modifications with weight loss  as part of management for hypertension.  6.  Disposition: Patient will continue to follow blood pressure at home and notify us if she is consistently greater than 494 systolic.  Follow-up in 6 months or sooner if necessary.  Murray Hodgkins, NP 10/04/2020, 1:04 PM

## 2020-10-04 NOTE — Patient Instructions (Signed)
Medication Instructions:  Amlodipine and valsartan were refilled for 90 days prescription   Lab Work: None  Testing/Procedures: none   Follow-Up: At [facility], you and your health needs are our priority.  As part of our continuing mission to provide you with exceptional heart care, we have created designated Provider Care Teams.  These Care Teams include your primary Cardiologist (physician) and Advanced Practice Providers (APPs -  Physician Assistants and Nurse Practitioners) who all work together to provide you with the care you need, when you need it.  We recommend signing up for the patient portal called "MyChart".  Sign up information is provided on this After Visit Summary.  MyChart is used to connect with patients for Virtual Visits (Telemedicine).  Patients are able to view lab/test results, encounter notes, upcoming appointments, etc.  Non-urgent messages can be sent to your provider as well.   To learn more about what you can do with MyChart, go to [email or web address].    Your next appointment:   6 month(s)  The format for your next appointment:   In Person  Provider:   [name], MD   Other Instructions n/a

## 2020-10-08 NOTE — Addendum Note (Signed)
Addended by: Kendrick Fries on: 10/08/2020 08:42 AM   Modules accepted: Orders

## 2020-10-11 ENCOUNTER — Ambulatory Visit: Payer: Medicare Other | Admitting: Family

## 2021-01-28 NOTE — Progress Notes (Signed)
11/04/2018  2:54 PM   Tracey Mosley March 31, 1951 [id number]  Referring provider: Marinda Elk, MD [facility] [place] [address]  Chief Complaint  Patient presents with  . Follow-up   Urological history: 1. High risk hematuria -non-smoker -cysto NED 2019  -CTU 01/2019 The adrenal glands appear normal. Stone within the inferior pole of left kidney measures 5 mm, image 32/2.  No right renal calculi. No hydronephrosis identified bilaterally. No kidney mass. Urinary bladder appears within normal limits -UA 3-10 RBC's  2. Nephrolithiasis -stone composition of 47% calcium phosphate carbonate, 38% calcium oxalate dihydrate and 15% calcium oxalate monohydrate -ESWL x 3 -KUB 01/29/2021 5 mm left renal stone    HPI: Tracey Mosley is a 70 y.o. female who presents today for a yearly follow up.   She has no urinary complaints at this visit.  Patient denies any modifying or aggravating factors.  Patient denies any gross hematuria, dysuria or suprapubic/flank pain.  Patient denies any fevers, chills, nausea or vomiting.   UA 3-10 RBC's.  KUB stable left renal stone.  PMH: Past Medical History:  Diagnosis Date  . Abnormal ECG    a. inferior Q's, poor R progression - dates back to 06/2014; b. 05/2017 MV: No ischemia/infarct.  . History of echocardiogram    a. 04/2017 Echo: EF 55-60%, nl RV size/fxn.  . Hyperlipidemia   . Hypertension   . Kidney stones   . Osteoporosis   . Thyroid disease     Surgical History: Past Surgical History:  Procedure Laterality Date  . CHOLECYSTECTOMY    . EXTRACORPOREAL SHOCK WAVE LITHOTRIPSY Left 09/09/2017   Procedure: EXTRACORPOREAL SHOCK WAVE LITHOTRIPSY (ESWL);  Surgeon: Abbie Sons, MD;  Location: [facility] ORS;  Service: Urology;  Laterality: Left;  . EXTRACORPOREAL SHOCK WAVE LITHOTRIPSY Left 09/16/2017   Procedure: EXTRACORPOREAL SHOCK WAVE LITHOTRIPSY (ESWL);  Surgeon: Hollice Espy, MD;   Location: [facility] ORS;  Service: Urology;  Laterality: Left;  . LITHOTRIPSY      Home Medications:  Allergies as of 01/29/2021      Reactions   Codeine Nausea Only   Codeine Sulfate Nausea Only   Epinephrine Other (See Comments)   Tremors   Sulfa Antibiotics Nausea And Vomiting      Medication List       Accurate as of Jan 29, 2021 11:59 PM. If you have any questions, ask your nurse or doctor.        amLODipine 5 MG tablet Commonly known as: NORVASC Take 1 tablet (5 mg total) by mouth daily.   Cyanocobalamin 1000 MCG/ML Kit Inject as directed every 30 (thirty) days.   escitalopram 20 MG tablet Commonly known as: LEXAPRO TAKE 1 TABLET BY MOUTH ONCE DAILY   fluticasone 50 MCG/ACT nasal spray Commonly known as: FLONASE Place 1 spray into both nostrils daily.   glycopyrrolate 1 MG tablet Commonly known as: ROBINUL Take 1 mg by mouth 2 (two) times daily.   hydrALAZINE 25 MG tablet Commonly known as: APRESOLINE Take by mouth daily.   hydrochlorothiazide 25 MG tablet Commonly known as: HYDRODIURIL TAKE 1 TABLET BY MOUTH  DAILY   levothyroxine 50 MCG tablet Commonly known as: SYNTHROID Take 50 mcg by mouth daily before breakfast.   Multi-Vitamins Tabs Take by mouth.   simvastatin 20 MG tablet Commonly known as: ZOCOR TAKE 1 TABLET BY MOUTH  NIGHTLY   valsartan 160 MG tablet Commonly known as: Diovan Take 1 tablet (160 mg total)  by mouth daily.       Allergies:  Allergies  Allergen Reactions  . Codeine Nausea Only  . Codeine Sulfate Nausea Only  . Epinephrine Other (See Comments)    Tremors  . Sulfa Antibiotics Nausea And Vomiting    Family History: Family History  Problem Relation Age of Onset  . Cancer Brother        Abdomina  . Heart disease Mother        leaky valves  . Heart attack Father 6  . Aneurysm Father   . Breast cancer Neg Hx     Social History:  reports that she has never smoked. She has never used smokeless tobacco. She reports  current alcohol use of about 2.0 standard drinks of alcohol per week. She reports that she does not use drugs.  ROS: For pertinent review of systems please refer to history of present illness  Physical Exam: BP (!) 149/85   Pulse 82   Ht 5' 6" (1.676 m)   Wt 202 lb (91.6 kg)   BMI 32.60 kg/m   Constitutional:  Well nourished. Alert and oriented, No acute distress. HEENT: [place] AT, mask in place.  Trachea midline Cardiovascular: No clubbing, cyanosis, or edema. Respiratory: Normal respiratory effort, no increased work of breathing. Neurologic: Grossly intact, no focal deficits, moving all 4 extremities. Psychiatric: Normal mood and affect.   Laboratory Data: Thyroid Stimulating Hormone (TSH) 0.450-5.330 uIU/ml uIU/mL 4.918   Comment: Reference Range for Pregnant Females >= 53 yrs old:  Normal Range for 1st trimester: 0.05-3.70 ulU/ml  Normal Range for 2nd trimester: 0.31-4.35 ulU/ml  Resulting [facility] - LAB  Specimen Collected: 12/11/20 09:18 Last Resulted: 12/11/20 11:47  Received From: [facility]  Result Received: 01/28/21 11:42   WBC (White Blood Cell Count) 4.1 - 10.2 10^3/uL 8.3   RBC (Red Blood Cell Count) 4.04 - 5.48 10^6/uL 4.86   Hemoglobin 12.0 - 15.0 gm/dL 15.5High   Hematocrit 35.0 - 47.0 % 46.3   MCV (Mean Corpuscular Volume) 80.0 - 100.0 fl 95.3   MCH (Mean Corpuscular Hemoglobin) 27.0 - 31.2 pg 31.9High   MCHC (Mean Corpuscular Hemoglobin Concentration) 32.0 - 36.0 gm/dL 33.5   Platelet Count 150 - 450 10^3/uL 277   RDW-CV (Red Cell Distribution Width) 11.6 - 14.8 % 11.4Low   MPV (Mean Platelet Volume) 9.4 - 12.4 fl 9.7   Neutrophils 1.50 - 7.80 10^3/uL 4.72   Lymphocytes 1.00 - 3.60 10^3/uL 2.88   Monocytes 0.00 - 1.50 10^3/uL 0.39   Eosinophils 0.00 - 0.55 10^3/uL 0.20   Basophils 0.00 - 0.09 10^3/uL 0.07   Neutrophil % 32.0 - 70.0 % 57.1   Lymphocyte % 10.0 - 50.0 % 34.8   Monocyte % 4.0 - 13.0 % 4.7    Eosinophil % 1.0 - 5.0 % 2.4   Basophil% 0.0 - 2.0 % 0.8   Immature Granulocyte % <=0.7 % 0.2   Immature Granulocyte Count <=0.06 10^3/L 0.02   Resulting Agency  [facility] - [facility]  Specimen Collected: 07/29/20 08:38 Last Resulted: 07/29/20 08:58  Received From: [facility]  Result Received: 01/28/21 11:42   Glucose 70 - 110 mg/dL 111High   Sodium 136 - 145 mmol/L 140   Potassium 3.6 - 5.1 mmol/L 3.8   Chloride 97 - 109 mmol/L 103   Carbon Dioxide (CO2) 22.0 - 32.0 mmol/L 29.0   Urea Nitrogen (BUN) 7 - 25 mg/dL 18   Creatinine 0.6 -  1.1 mg/dL 1.0   Glomerular Filtration Rate (eGFR), MDRD Estimate >60 mL/min/1.73sq m 55Low   Calcium 8.7 - 10.3 mg/dL 9.7   AST  8 - 39 U/L 20   ALT  5 - 38 U/L 16   Alk Phos (alkaline Phosphatase) 34 - 104 U/L 48   Albumin 3.5 - 4.8 g/dL 4.5   Bilirubin, Total 0.3 - 1.2 mg/dL 1.2   Protein, Total 6.1 - 7.9 g/dL 7.6   A/G Ratio 1.0 - 5.0 gm/dL 1.5   Resulting Agency  [facility] - LAB  Specimen Collected: 07/29/20 08:38 Last Resulted: 07/29/20 11:05  Received From: [facility]  Result Received: 01/28/21 11:42   Cholesterol, Total 100 - 200 mg/dL 168   Triglyceride 35 - 199 mg/dL 115   HDL (High Density Lipoprotein) Cholesterol 35.0 - 85.0 mg/dL 47.5   LDL Calculated 0 - 130 mg/dL 98   VLDL Cholesterol mg/dL 23   Cholesterol/HDL Ratio  3.5   [facility] - [facility]  Specimen Collected: 07/29/20 08:38 Last Resulted: 07/29/20 11:06  Received From: [facility]  Result Received: 01/28/21 11:42   Color Yellow Yellow   Clarity Clear Clear   Specific Gravity 1.000 - 1.030 1.025   pH, Urine 5.0 - 8.0 6.0   Protein, Urinalysis Negative, Trace mg/dL Trace   Glucose, Urinalysis Negative mg/dL Negative   Ketones, Urinalysis Negative mg/dL TraceAbnormal   Blood, Urinalysis Negative ModerateAbnormal   Nitrite, Urinalysis Negative Negative   Leukocyte  Esterase, Urinalysis Negative Negative   White Blood Cells, Urinalysis None Seen, 0-3 /hpf None Seen   Red Blood Cells, Urinalysis None Seen, 0-3 /hpf 4-10Abnormal   Bacteria, Urinalysis None Seen /hpf ModerateAbnormal   Squamous Epithelial Cells, Urinalysis Rare, Few, None Seen /hpf Few   Resulting Agency  [facility] - [facility]  Specimen Collected: 07/29/20 08:38 Last Resulted: 07/29/20 09:52  Received From: [facility]  Result Received: 01/28/21 11:42   Hemoglobin A1C 4.2 - 5.6 % 5.6   Average Blood Glucose (Calc) mg/dL 114   Resulting Agency  [facility] - LAB   Narrative Performed by [facility] - LAB Normal Range:  4.2 - 5.6%  Increased Risk: 5.7 - 6.4%  Diabetes:    >= 6.5%  Glycemic Control for adults with diabetes: <7%  Specimen Collected: 07/29/20 08:38 Last Resulted: 07/29/20 10:39  Received From: [facility]  Result Received: 01/28/21 11:42   Urinalysis Component     Latest Ref Rng & Units 01/29/2021  Specific Gravity, UA     1.005 - 1.030 1.025  pH, UA     5.0 - 7.5 5.5  Color, UA     Yellow Yellow  Appearance Ur     Clear Hazy (A)  Leukocytes,UA     Negative Trace (A)  Protein,UA     Negative/Trace 1+ (A)  Glucose, UA     Negative Trace (A)  Ketones, UA     Negative Trace (A)  RBC, UA     Negative 1+ (A)  Bilirubin, UA     Negative Negative  Urobilinogen, Ur     0.2 - 1.0 mg/dL 1.0  Nitrite, UA     Negative Negative  Microscopic Examination      See below:   Component     Latest Ref Rng & Units 01/29/2021  WBC, UA     0 - 5 /hpf 0-5  RBC  0 - 2 /hpf 3-10 (A)  Epithelial Cells (non renal)     0 - 10 /hpf 0-10  Casts     None seen /lpf Present (A)  Cast Type     N/A Granular casts (A)  Bacteria, UA     None seen/Few Few  I have reviewed the labs.  Pertinent Imaging: CLINICAL DATA:  History of right-sided kidney stones  EXAM: ABDOMEN - 1 VIEW  COMPARISON:   None.  FINDINGS: The bowel gas pattern is normal. Unchanged 5 mm LEFT lower pole renal stone. No RIGHT renal calculus visualized. No radiopaque calculi visualized over expected courses of the bilateral ureters cholecystectomy clips. Thoracolumbar spondylosis with levoconvex curvature of the lumbar spine.  IMPRESSION: Unchanged 5 mm LEFT lower pole renal stone. No right renal stone visualized.   Electronically Signed   By: Dahlia Bailiff MD   On: 01/31/2021 09:36 I have independently reviewed the films.  See HPI.     Assessment & Plan:    1. Microscopic  Hematuria -discussed that the micro heme may be the result of her stone or her CKD, but a GU malignancy may also cause micro heme -explained that it has been three years since her last cystoscopy, discussed that recommendations are to repeat the hematuria workup every 2 to 5 years for persistent micro heme -as her last upper tract imaging was in 2020, we will not repeat this at this time but would recommend undergoing a cystoscopy at this time -I have explained to the patient that they will  be scheduled for a cystoscopy in our office to evaluate their bladder.  The cystoscopy consists of passing a tube with a lens up through their urethra and into their urinary bladder.   We will inject the urethra with a lidocaine gel prior to introducing the cystoscope to help with any discomfort during the procedure.   After the procedure, they might experience blood in the urine and discomfort with urination.  This will abate after the first few voids.  I have  encouraged the patient to increase water intake  during this time.  Patient denies any allergies to lidocaine.   2. Left lower pole stone -Stone stable on today's KUB -RTC in one year for KUB    Return for cysto for micro heme .  Zara Council, PA-C  [facility] [address], [place], [place] [address] [phone]

## 2021-01-29 ENCOUNTER — Ambulatory Visit
Admission: RE | Admit: 2021-01-29 | Discharge: 2021-01-29 | Disposition: A | Discharge status: Home / Self Care | Payer: Medicare Other | Source: Ambulatory Visit | Attending: Urology | Admitting: Urology

## 2021-01-29 ENCOUNTER — Ambulatory Visit (INDEPENDENT_AMBULATORY_CARE_PROVIDER_SITE_OTHER): Payer: Medicare Other | Admitting: Urology

## 2021-01-29 ENCOUNTER — Encounter: Payer: Self-pay | Admitting: Urology

## 2021-01-29 ENCOUNTER — Other Ambulatory Visit: Payer: Self-pay

## 2021-01-29 ENCOUNTER — Ambulatory Visit
Admission: RE | Admit: 2021-01-29 | Discharge: 2021-01-29 | Disposition: A | Discharge status: Home / Self Care | Payer: Medicare Other | Attending: Urology | Admitting: Urology

## 2021-01-29 VITALS — BP 149/85 | HR 82 | Ht 66.0 in | Wt 202.0 lb

## 2021-01-29 DIAGNOSIS — N200 Calculus of kidney: Secondary | ICD-10-CM | POA: Diagnosis present

## 2021-01-29 DIAGNOSIS — R3129 Other microscopic hematuria: Secondary | ICD-10-CM | POA: Diagnosis not present

## 2021-01-29 LAB — URINALYSIS, COMPLETE
Bilirubin, UA: NEGATIVE
Nitrite, UA: NEGATIVE
Specific Gravity, UA: 1.025 (ref 1.005–1.030)
Urobilinogen, Ur: 1 mg/dL (ref 0.2–1.0)
pH, UA: 5.5 (ref 5.0–7.5)

## 2021-01-29 LAB — MICROSCOPIC EXAMINATION

## 2021-01-29 NOTE — Patient Instructions (Signed)
Cystoscopy Cystoscopy is a procedure that is used to help diagnose and sometimes treat conditions that affect the lower urinary tract. The lower urinary tract includes the bladder and the urethra. The urethra is the tube that drains urine from the bladder. Cystoscopy is done using a thin, tube-shaped instrument with a light and camera at the end (cystoscope). The cystoscope may be hard or flexible, depending on the goal of the procedure. The cystoscope is inserted through the urethra, into the bladder. Cystoscopy may be recommended if you have:  Urinary tract infections that keep coming back.  Blood in the urine (hematuria).  An inability to control when you urinate (urinary incontinence) or an overactive bladder.  Unusual cells found in a urine sample.  A blockage in the urethra, such as a urinary stone.  Painful urination.  An abnormality in the bladder found during an intravenous pyelogram (IVP) or CT scan. Cystoscopy may also be done to remove a sample of tissue to be examined under a microscope (biopsy). Tell a health care provider about:  Any allergies you have.  All medicines you are taking, including vitamins, herbs, eye drops, creams, and over-the-counter medicines.  Any problems you or family members have had with anesthetic medicines.  Any blood disorders you have.  Any surgeries you have had.  Any medical conditions you have.  Whether you are pregnant or may be pregnant. What are the risks? Generally, this is a safe procedure. However, problems may occur, including:  Infection.  Bleeding.  Allergic reactions to medicines.  Damage to other structures or organs. What happens before the procedure? Medicines Ask your health care provider about:  Changing or stopping your regular medicines. This is especially important if you are taking diabetes medicines or blood thinners.  Taking medicines such as aspirin and ibuprofen. These medicines can thin your blood. Do  not take these medicines unless your health care provider tells you to take them.  Taking over-the-counter medicines, vitamins, herbs, and supplements. Tests You may have an exam or testing, such as:  X-rays of the bladder, urethra, or kidneys.  CT scan of the abdomen or pelvis.  Urine tests to check for signs of infection. General instructions  Follow instructions from your health care provider about eating or drinking restrictions.  Ask your health care provider what steps will be taken to help prevent infection. These steps may include: ? Washing skin with a germ-killing soap. ? Taking antibiotic medicine.  Plan to have a responsible adult take you home from the hospital or clinic. What happens during the procedure?  You will be given one or more of the following: ? A medicine to help you relax (sedative). ? A medicine to numb the area (local anesthetic).  The area around the opening of your urethra will be cleaned.  The cystoscope will be passed through your urethra into your bladder.  Germ-free (sterile) fluid will flow through the cystoscope to fill your bladder. The fluid will stretch your bladder so that your health care provider can clearly examine your bladder walls.  Your doctor will look at the urethra and bladder. Your doctor may take a biopsy or remove stones.  The cystoscope will be removed, and your bladder will be emptied. The procedure may vary among health care providers and hospitals.   What can I expect after the procedure? After the procedure, it is common to have:  Some soreness or pain in your abdomen and urethra.  Urinary symptoms. These include: ? Mild pain or burning   when you urinate. Pain should stop within a few minutes after you urinate. This may last for up to 1 week. ? A small amount of blood in your urine for several days. ? Feeling like you need to urinate but producing only a small amount of urine. Follow these instructions at  home: Medicines  Take over-the-counter and prescription medicines only as told by your health care provider.  If you were prescribed an antibiotic medicine, take it as told by your health care provider. Do not stop taking the antibiotic even if you start to feel better. General instructions  Return to your normal activities as told by your health care provider. Ask your health care provider what activities are safe for you.  If you were given a sedative during the procedure, it can affect you for several hours. Do not drive or operate machinery until your health care provider says that it is safe.  Watch for any blood in your urine. If the amount of blood in your urine increases, call your health care provider.  Follow instructions from your health care provider about eating or drinking restrictions.  If a tissue sample was removed for testing (biopsy) during your procedure, it is up to you to get your test results. Ask your health care provider, or the department that is doing the test, when your results will be ready.  Drink enough fluid to keep your urine pale yellow.  Keep all follow-up visits. This is important. Contact a health care provider if:  You have pain that gets worse or does not get better with medicine, especially pain when you urinate.  You have trouble urinating.  You have more blood in your urine. Get help right away if:  You have blood clots in your urine.  You have abdominal pain.  You have a fever or chills.  You are unable to urinate. Summary  Cystoscopy is a procedure that is used to help diagnose and sometimes treat conditions that affect the lower urinary tract.  Cystoscopy is done using a thin, tube-shaped instrument with a light and camera at the end.  After the procedure, it is common to have some soreness or pain in your abdomen and urethra.  Watch for any blood in your urine. If the amount of blood in your urine increases, call your health  care provider.  If you were prescribed an antibiotic medicine, take it as told by your health care provider. Do not stop taking the antibiotic even if you start to feel better. This information is not intended to replace advice given to you by your health care provider. Make sure you discuss any questions you have with your health care provider. Document Revised: 04/26/2020 Document Reviewed: 04/26/2020 Elsevier Patient Education  2021 [employer] Inc[employer]  

## 2021-02-12 ENCOUNTER — Encounter: Payer: Self-pay | Admitting: Urology

## 2021-02-12 ENCOUNTER — Ambulatory Visit (INDEPENDENT_AMBULATORY_CARE_PROVIDER_SITE_OTHER): Payer: Medicare Other | Admitting: Urology

## 2021-02-12 ENCOUNTER — Other Ambulatory Visit: Payer: Self-pay

## 2021-02-12 VITALS — BP 149/86 | HR 76 | Wt 202.0 lb

## 2021-02-12 DIAGNOSIS — N200 Calculus of kidney: Secondary | ICD-10-CM

## 2021-02-12 DIAGNOSIS — R3129 Other microscopic hematuria: Secondary | ICD-10-CM

## 2021-02-12 NOTE — Progress Notes (Addendum)
   02/12/21  CC:  Chief Complaint  Patient presents with  . Cysto    HPI: 70 year old female with a personal history of microscopic hematuria, and kidney stones who presents today for cystoscopy.  Please see notes from [facility] on 01/29/2021.  Blood pressure (!) 149/86, pulse 76, weight 202 lb (91.6 kg). NED. A&Ox3.   No respiratory distress   Abd soft, NT, ND Normal external genitalia with patent urethral meatus  Cystoscopy Procedure Note  Patient identification was confirmed, informed consent was obtained, and patient was prepped using Betadine solution.  Lidocaine jelly was administered per urethral meatus.    Procedure: - Flexible cystoscope introduced, without any difficulty.   - Thorough search of the bladder revealed:    normal urethral meatus    normal urothelium    no stones    no ulcers     no tumors    no urethral polyps    no trabeculation  - Ureteral orifices were normal in position and appearance.  There is a small amount of chronic appearing trigonitis in between the UOs but no papillary changes or concern for underlying malignancy.  Post-Procedure: - Patient tolerated the procedure well  Assessment/ Plan:  1. Left renal stone Small, asymptomatic, will follow clinically  2. Microscopic hematuria Status post unremarkable cystoscopy today  Given that she is high risk, will continue to check her UA annually.  If she does have microscopic blood next year, consider renal ultrasound for upper tract imaging or repeat cystoscopy every 2 to 3 years unless her hematuria worsens then sooner - Urinalysis, Complete  F/u 1 year for UA   Vanna Scotland, MD

## 2021-02-14 LAB — MICROSCOPIC EXAMINATION: Bacteria, UA: NONE SEEN

## 2021-02-14 LAB — URINALYSIS, COMPLETE
Bilirubin, UA: NEGATIVE
Glucose, UA: NEGATIVE
Leukocytes,UA: NEGATIVE
Nitrite, UA: NEGATIVE
Specific Gravity, UA: 1.02 (ref 1.005–1.030)
Urobilinogen, Ur: 1 mg/dL (ref 0.2–1.0)
pH, UA: 5.5 (ref 5.0–7.5)

## 2021-04-15 NOTE — Progress Notes (Signed)
Follow-up Outpatient Visit Date: 04/16/2021  Primary Care Provider: Marinda Elk, [place] [address]  Chief Complaint: Follow-up hypertension  HPI:  Tracey Mosley is a 70 y.o. female with history of hypertension, hyperlipidemia, hypothyroidism, chronic kidney disease stage III, and obesity, who presents for follow-up of hypertension and orthostatic lightheadedness.  She was last seen in our office in January by Ignacia Bayley, NP, at which time she was feeling relatively well but noted some elevations in blood pressure with systolic readings into the 150s.  She also noted intermittent orthostatic lightheadedness.  Tracey Mosley was encouraged to minimize her sodium intake and to work on weight loss.  No medication changes or additional testing were pursued.  Today, Tracey Mosley reports that she has been feeling relatively well though she reports an episode of pronounced shortness of breath after walking through the sand during a recent trip to the beach.  She otherwise feels like her breathing has been stable.  She denies chest pain and palpitations.  She reports an episode of bilateral neck pain that lasted less than an hour a few months ago.  There were no other associated symptoms or obvious precipitants for the pain.  It has not recurred.  She notes occasional orthostatic lightheadedness if she gets up too quickly.  Systolic blood pressures at home have remained elevated, usually between 140 and 150 mmHg.  --------------------------------------------------------------------------------------------------  Cardiovascular History & Procedures: Cardiovascular Problems: Hypertension Orthostatic lightheadedness   Risk Factors: Hypertension, hyperlipidemia, and obesity   Cath/PCI: None.   CV Surgery: None.   EP Procedures and Devices: None.   Non-Invasive Evaluation(s): Pharmacologic MPI (06/08/17): Low risk study without ischemia or scar.  Normal LVEF. Renal artery Doppler (05/17/2017): No evidence of significant renal artery stenosis. TTE (05/18/17): Normal LV size and function. LVEF 55-60%. Normal RV size and function. No significant valvular abnormalities.  Recent CV Pertinent Labs: Lab Results  Component Value Date   K 4.2 05/06/2017   K 4.5 03/17/2014   BUN 22 (H) 05/06/2017   BUN 22 (H) 03/17/2014   CREATININE 1.02 (H) 05/06/2017   CREATININE 0.84 03/17/2014    Past medical and surgical history were reviewed and updated in EPIC.  Current Meds  Medication Sig   amLODipine (NORVASC) 5 MG tablet Take 1 tablet (5 mg total) by mouth daily.   Cyanocobalamin 1000 MCG/ML KIT Inject as directed every 30 (thirty) days.   escitalopram (LEXAPRO) 20 MG tablet TAKE 1 TABLET BY MOUTH ONCE DAILY   fluticasone (FLONASE) 50 MCG/ACT nasal spray Place 1 spray into both nostrils daily.    glycopyrrolate (ROBINUL) 1 MG tablet Take 1 mg by mouth 2 (two) times daily.   hydrochlorothiazide (HYDRODIURIL) 25 MG tablet TAKE 1 TABLET BY MOUTH  DAILY   levothyroxine (SYNTHROID, LEVOTHROID) 50 MCG tablet Take 50 mcg by mouth daily before breakfast.    Multiple Vitamin (MULTI-VITAMINS) TABS Take by mouth.   nystatin cream (MYCOSTATIN) Apply 1 application topically 2 (two) times daily as needed.   simvastatin (ZOCOR) 20 MG tablet TAKE 1 TABLET BY MOUTH  NIGHTLY   valsartan (DIOVAN) 160 MG tablet Take 1 tablet (160 mg total) by mouth daily.   [DISCONTINUED] lisinopril (ZESTRIL) 40 MG tablet Take by mouth.    Allergies: Codeine, Codeine sulfate, Epinephrine, and Sulfa antibiotics  Social History   Tobacco Use   Smoking status: Never   Smokeless tobacco: Never  Vaping Use   Vaping Use: Never used  Substance Use  Topics   Alcohol use: Yes    Alcohol/week: 2.0 standard drinks    Types: 2 Cans of beer per week    Comment: once every 3-4 months   Drug use: No    Family History  Problem Relation Age of Onset   Cancer Brother         Abdomina   Heart disease Mother        leaky valves   Heart attack Father 97   Aneurysm Father    Breast cancer Neg Hx     Review of Systems: A 12-system review of systems was performed and was negative except as noted in the HPI.  --------------------------------------------------------------------------------------------------  Physical Exam: BP (!) 144/90 (BP Location: Left Arm, Patient Position: Sitting, Cuff Size: Large)   Pulse 71   Ht _0  (1.676 m)   Wt 204 lb (92.5 kg)   SpO2 97%   BMI 32.93 kg/m   General:  NAD. Neck: No JVD or HJR. Lungs: Clear to auscultation bilaterally without wheezes or crackles. Heart: Regular rate and rhythm without murmurs, rubs, or gallops. Abdomen: Soft, nontender, nondistended. Extremities: No lower extremity edema.  EKG: Normal sinus rhythm with poor R wave progression and inferior Q waves.  No significant change since 10/04/2020.  Lab Results  Component Value Date   WBC 9.1 03/17/2014   HGB 14.1 03/17/2014   HCT 42.0 03/17/2014   MCV 94 03/17/2014   PLT 206 03/17/2014    Lab Results  Component Value Date   NA 141 05/06/2017   K 4.2 05/06/2017   CL 106 05/06/2017   CO2 25 05/06/2017   BUN 22 (H) 05/06/2017   CREATININE 1.02 (H) 05/06/2017   GLUCOSE 100 (H) 05/06/2017   ALT 25 05/06/2017    No results found for: CHOL, HDL, LDLCALC, LDLDIRECT, TRIG, CHOLHDL  --------------------------------------------------------------------------------------------------  ASSESSMENT AND PLAN: Hypertension: Blood pressure remains suboptimally controlled today.  We have agreed to escalate valsartan to 320 mg daily and continue current doses of amlodipine and HCTZ.  Sodium restriction and weight loss through diet and exercise were encouraged.  We will plan to repeat a BMP in 2 weeks to ensure stable renal function and electrolytes.  Dyspnea on exertion: Single episode noted while walking through the sand at the beach few weeks ago.  Ms.  Mosley appears euvolemic on exam today and otherwise does not report symptoms of heart failure.  EKG shows stable findings of inferior Q waves and poor R wave progression that date back to 2018 when MPI was without ischemia or scar.  We discussed repeating echo +/- ischemia evaluation but have agreed to defer this in favor of optimization of elevated blood pressure.  Hyperlipidemia: Continue simvastatin.  Follow-up: Return to clinic in 3 months.  Nelva Bush, MD 04/16/2021 10:38 AM

## 2021-04-16 ENCOUNTER — Ambulatory Visit (INDEPENDENT_AMBULATORY_CARE_PROVIDER_SITE_OTHER): Payer: Medicare Other | Admitting: Internal Medicine

## 2021-04-16 ENCOUNTER — Encounter: Payer: Self-pay | Admitting: Internal Medicine

## 2021-04-16 ENCOUNTER — Other Ambulatory Visit: Payer: Self-pay

## 2021-04-16 VITALS — BP 144/90 | HR 71 | Ht 66.0 in | Wt 204.0 lb

## 2021-04-16 DIAGNOSIS — R0600 Dyspnea, unspecified: Secondary | ICD-10-CM

## 2021-04-16 DIAGNOSIS — Z79899 Other long term (current) drug therapy: Secondary | ICD-10-CM

## 2021-04-16 DIAGNOSIS — I10 Essential (primary) hypertension: Secondary | ICD-10-CM

## 2021-04-16 DIAGNOSIS — E782 Mixed hyperlipidemia: Secondary | ICD-10-CM

## 2021-04-16 DIAGNOSIS — R0609 Other forms of dyspnea: Secondary | ICD-10-CM

## 2021-04-16 MED ORDER — VALSARTAN 320 MG PO TABS: 320.0000 mg | ORAL_TABLET | Freq: Every day | ORAL | 3 refills | Status: DC

## 2021-04-16 NOTE — Patient Instructions (Signed)
Medication Instructions:   Your physician has recommended you make the following change in your medication:   INCREASE Valsartan to 320mg  daily - A new Rx has been sent to your pharmacy  *If you need a refill on your cardiac medications before your next appointment, please call your pharmacy*   Lab Work:  -  Your physician recommends that you return for lab work in: 2 weeks (BMET) This lab is NOT fasting  -  Please go to the [facility]. You will check in at the front desk to the right as you walk into the atrium. Valet Parking is offered if needed.  - No appointment needed. You may go any day between 7 am and 6 pm.    Testing/Procedures:  None ordered   Follow-Up: At [facility], you and your health needs are our priority.  As part of our continuing mission to provide you with exceptional heart care, we have created designated Provider Care Teams.  These Care Teams include your primary Cardiologist (physician) and Advanced Practice Providers (APPs -  Physician Assistants and Nurse Practitioners) who all work together to provide you with the care you need, when you need it.  We recommend signing up for the patient portal called "MyChart".  Sign up information is provided on this After Visit Summary.  MyChart is used to connect with patients for Virtual Visits (Telemedicine).  Patients are able to view lab/test results, encounter notes, upcoming appointments, etc.  Non-urgent messages can be sent to your provider as well.   To learn more about what you can do with MyChart, go to [facility].    Your next appointment:   3 month(s)  The format for your next appointment:   In Person  Provider:   You may see [email or web address], MD or one of the following Advanced Practice Providers on your designated Care Team:   [name], NP Nicolasa Ducking, PA-C Eula Listen, PA-C Cadence Aurora, [place]

## 2021-04-30 ENCOUNTER — Other Ambulatory Visit
Admission: RE | Admit: 2021-04-30 | Discharge: 2021-04-30 | Disposition: A | Discharge status: Home / Self Care | Payer: Medicare Other | Source: Ambulatory Visit | Attending: Internal Medicine | Admitting: Internal Medicine

## 2021-04-30 DIAGNOSIS — Z79899 Other long term (current) drug therapy: Secondary | ICD-10-CM | POA: Diagnosis present

## 2021-04-30 DIAGNOSIS — I10 Essential (primary) hypertension: Secondary | ICD-10-CM | POA: Insufficient documentation

## 2021-04-30 LAB — BASIC METABOLIC PANEL
Anion gap: 11 (ref 5–15)
BUN: 25 mg/dL — ABNORMAL HIGH (ref 8–23)
CO2: 25 mmol/L (ref 22–32)
Calcium: 9.4 mg/dL (ref 8.9–10.3)
Chloride: 103 mmol/L (ref 98–111)
Creatinine, Ser: 1.01 mg/dL — ABNORMAL HIGH (ref 0.44–1.00)
GFR, Estimated: >60 mL/min (ref >60)
Glucose, Bld: 102 mg/dL — ABNORMAL HIGH (ref 70–99)
Potassium: 3.4 mmol/L — ABNORMAL LOW (ref 3.5–5.1)
Sodium: 139 mmol/L (ref 135–145)

## 2021-07-17 ENCOUNTER — Other Ambulatory Visit: Payer: Self-pay

## 2021-07-17 ENCOUNTER — Ambulatory Visit (INDEPENDENT_AMBULATORY_CARE_PROVIDER_SITE_OTHER): Payer: Medicare Other | Admitting: Internal Medicine

## 2021-07-17 ENCOUNTER — Encounter: Payer: Self-pay | Admitting: Internal Medicine

## 2021-07-17 VITALS — BP 150/94 | HR 81 | Ht 66.0 in | Wt 201.0 lb

## 2021-07-17 DIAGNOSIS — E785 Hyperlipidemia, unspecified: Secondary | ICD-10-CM

## 2021-07-17 DIAGNOSIS — Z23 Encounter for immunization: Secondary | ICD-10-CM

## 2021-07-17 DIAGNOSIS — I10 Essential (primary) hypertension: Secondary | ICD-10-CM

## 2021-07-17 DIAGNOSIS — R0609 Other forms of dyspnea: Secondary | ICD-10-CM

## 2021-07-17 MED ORDER — AMLODIPINE BESYLATE 10 MG PO TABS: 10.0000 mg | ORAL_TABLET | Freq: Every day | ORAL | 3 refills | Status: DC

## 2021-07-17 NOTE — Patient Instructions (Signed)
Medication Instructions:  - Your physician has recommended you make the following change in your medication:   1) INCREASE amlodipine to 10 mg- take 1 tablet by mouth once daily   *If you need a refill on your cardiac medications before your next appointment, please call your pharmacy*   Lab Work: - none ordered  If you have labs (blood work) drawn today and your tests are completely normal, you will receive your results only by: MyChart Message (if you have MyChart) OR A paper copy in the mail If you have any lab test that is abnormal or we need to change your treatment, we will call you to review the results.   Testing/Procedures: - none ordered   Follow-Up: At [facility], you and your health needs are our priority.  As part of our continuing mission to provide you with exceptional heart care, we have created designated Provider Care Teams.  These Care Teams include your primary Cardiologist (physician) and Advanced Practice Providers (APPs -  Physician Assistants and Nurse Practitioners) who all work together to provide you with the care you need, when you need it.  We recommend signing up for the patient portal called "MyChart".  Sign up information is provided on this After Visit Summary.  MyChart is used to connect with patients for Virtual Visits (Telemedicine).  Patients are able to view lab/test results, encounter notes, upcoming appointments, etc.  Non-urgent messages can be sent to your provider as well.   To learn more about what you can do with MyChart, go to [email or web address].    Your next appointment:   6 month(s)  The format for your next appointment:   In Person  Provider:   You may see [name], MD or one of the following Advanced Practice Providers on your designated Care Team:   Nicolasa Ducking, NP Eula Listen, PA-C Marisue Ivan, PA-C Cadence Fransico Michael, [place]   Other Instructions  Flu shot given today

## 2021-07-17 NOTE — Progress Notes (Signed)
Follow-up Outpatient Visit Date: 07/17/2021  Primary Care Provider: Marinda Elk, [place] [address]  Chief Complaint: Follow-up hypertension  HPI:  Tracey Mosley is a 70 y.o. female with history of hypertension, hyperlipidemia, hypothyroidism, chronic kidney disease stage III, and obesity, who presents for follow-up of hypertension and orthostatic lightheadedness.  I last saw her in July, at which time she reported an episode of pronounced dyspnea after walking on the beach.  Blood pressure was mildly elevated.  We discussed repeating echo and ischemia evaluation but agreed to defer this in favor of improved blood pressure control.  Today, Ms. Brugger reports only rare palpitations, usually once or twice a month.  She has occasional shortness of breath when walking quickly, which may be slightly worse compared with last year.  She denies chest pain, lightheadedness, and edema.  She has been having intermittent leg spasms and shin pains, for which she uses prn meloxicam.  --------------------------------------------------------------------------------------------------  Cardiovascular History & Procedures: Cardiovascular Problems: Hypertension Orthostatic lightheadedness   Risk Factors: Hypertension, hyperlipidemia, and obesity   Cath/PCI: None.   CV Surgery: None.   EP Procedures and Devices: None.   Non-Invasive Evaluation(s): Pharmacologic MPI (06/08/17): Low risk study without ischemia or scar. Normal LVEF. Renal artery Doppler (05/17/2017): No evidence of significant renal artery stenosis. TTE (05/18/17): Normal LV size and function. LVEF 55-60%. Normal RV size and function. No significant valvular abnormalities.  Recent CV Pertinent Labs: Lab Results  Component Value Date   K 3.4 (L) 04/30/2021   K 4.5 03/17/2014   BUN 25 (H) 04/30/2021   BUN 22 (H) 03/17/2014   CREATININE 1.01 (H) 04/30/2021   CREATININE 0.84  03/17/2014    Past medical and surgical history were reviewed and updated in EPIC.  Current Meds  Medication Sig   amLODipine (NORVASC) 5 MG tablet Take 1 tablet (5 mg total) by mouth daily.   Cyanocobalamin 1000 MCG/ML KIT Inject as directed every 30 (thirty) days.   escitalopram (LEXAPRO) 20 MG tablet TAKE 1 TABLET BY MOUTH ONCE DAILY   fluticasone (FLONASE) 50 MCG/ACT nasal spray Place 1 spray into both nostrils daily.    glycopyrrolate (ROBINUL) 1 MG tablet Take 1 mg by mouth 2 (two) times daily.   hydrochlorothiazide (HYDRODIURIL) 25 MG tablet TAKE 1 TABLET BY MOUTH  DAILY   levothyroxine (SYNTHROID, LEVOTHROID) 50 MCG tablet Take 50 mcg by mouth daily before breakfast.    meloxicam (MOBIC) 7.5 MG tablet Take 7.5 mg by mouth daily as needed for pain.   Multiple Vitamin (MULTI-VITAMINS) TABS Take 1 tablet by mouth daily.   nystatin cream (MYCOSTATIN) Apply 1 application topically 2 (two) times daily as needed.   simvastatin (ZOCOR) 20 MG tablet TAKE 1 TABLET BY MOUTH  NIGHTLY   valsartan (DIOVAN) 320 MG tablet Take 1 tablet (320 mg total) by mouth daily.    Allergies: Codeine, Codeine sulfate, Epinephrine, and Sulfa antibiotics  Social History   Tobacco Use   Smoking status: Never   Smokeless tobacco: Never  Vaping Use   Vaping Use: Never used  Substance Use Topics   Alcohol use: Yes    Alcohol/week: 2.0 standard drinks    Types: 2 Cans of beer per week    Comment: once every 3-4 months   Drug use: No    Family History  Problem Relation Age of Onset   Cancer Brother        Abdomina   Heart disease Mother  leaky valves   Heart attack Father 15   Aneurysm Father    Breast cancer Neg Hx     Review of Systems: A 12-system review of systems was performed and was negative except as noted in the HPI.  --------------------------------------------------------------------------------------------------  Physical Exam: BP (!) 150/94 (BP Location: Left Arm, Patient  Position: Sitting, Cuff Size: Large)   Pulse 81   Ht '5\' 6"'  (1.676 m)   Wt 201 lb (91.2 kg)   SpO2 97%   BMI 32.44 kg/m  Repeat BP: 146/78  General:  NAD. Neck: No JVD or HJR. Lungs: Clear to auscultation bilaterally without wheezes or crackles. Heart: Regular rate and rhythm without murmurs, rubs, or gallops. Abdomen: Soft, nontender, nondistended. Extremities: No lower extremity edema.  Lab Results  Component Value Date   WBC 9.1 03/17/2014   HGB 14.1 03/17/2014   HCT 42.0 03/17/2014   MCV 94 03/17/2014   PLT 206 03/17/2014    Lab Results  Component Value Date   NA 139 04/30/2021   K 3.4 (L) 04/30/2021   CL 103 04/30/2021   CO2 25 04/30/2021   BUN 25 (H) 04/30/2021   CREATININE 1.01 (H) 04/30/2021   GLUCOSE 102 (H) 04/30/2021   ALT 25 05/06/2017    No results found for: CHOL, HDL, LDLCALC, LDLDIRECT, TRIG, CHOLHDL  --------------------------------------------------------------------------------------------------  ASSESSMENT AND PLAN: Hypertension: BP mildly elevated today.  We will increase amlodipine to 10 mg daily and continue current doses of HCTZ and valsartan.  BP can be reassessed at visit with PCP and endocrinology within the next month.  Dyspnea on exertion: Slightly worse than a year ago.  We will work on improved blood pressure control.  May need to reconsider repeat echo +/- ischemia evaluation at follow-up if symptoms persist.  Hyperlipidemia: Continue simvastatin with ongoing follow-up/management per PCP.  Influenza immunization: Seasonal flu vaccine administered at the patient's request.  Follow-up: Return to clinic in 6 months.  Nelva Bush, MD 07/17/2021 10:01 AM

## 2021-07-24 ENCOUNTER — Telehealth: Payer: Self-pay | Admitting: Internal Medicine

## 2021-07-24 NOTE — Telephone Encounter (Signed)
Pt's Amlodipine was incr from 5 mg to 10 mg at last ov 07/17/21.  Spoke with pt and reports swelling in both feet and ankles.  Swelling started the first day of incr dose.  Pt reports that she never experienced swelling with Amlodipine 5 mg.  Pt has reduced dose on her own and took 5 mg only this morning.   Advised pt I will make Dr. Okey Dupre aware and call her with further recc.  Pt has no further c/o and voiced understanding.

## 2021-07-24 NOTE — Telephone Encounter (Signed)
Pt c/o medication issue:  1. Name of Medication: amlodipine   2. How are you currently taking this medication (dosage and times per day)? 10 MG daily   3. Are you having a reaction (difficulty breathing--STAT)? Swelling of feet and toes   4. What is your medication issue? Dr End increased it last week from 5 MG to 10 MG - would like to know if she can go back to 5 MG. Please call to discuss.

## 2021-07-27 NOTE — Telephone Encounter (Signed)
Given swelling associated with escalation of amlodipine, I agree with going back to 5 mg daily.  She should monitor her BP at home and let us know if it is consistent above 140/90.  If so, we will need to consider alternative strategy to achieve target BP.  She should keep working on lifestyle modifications, including sodium restriction and exercise.  [name], MD [facility]

## 2021-07-28 MED ORDER — AMLODIPINE BESYLATE 5 MG PO TABS: 5.0000 mg | ORAL_TABLET | Freq: Every day | ORAL | Status: DC

## 2021-07-28 NOTE — Telephone Encounter (Signed)
Spoke with pt. Notified of Dr. Serita Kyle recc.  Pt voiced understanding.  She will continue Amlodipine 5 mg daily, monitor BP at home and will let us know if consistently above 140/90.  Pt will continue working on lifestyle modifications including sodium restriction and exercise.  [place] the beach and states she will return this week and will begin monitoring BP.  Pt has no further questions at this time.  Med list updated.

## 2021-09-26 ENCOUNTER — Telehealth: Payer: Self-pay | Admitting: Internal Medicine

## 2021-09-26 ENCOUNTER — Other Ambulatory Visit: Payer: Self-pay | Admitting: Nurse Practitioner

## 2021-09-26 MED ORDER — SIMVASTATIN 20 MG PO TABS: 20.0000 mg | ORAL_TABLET | Freq: Every day | ORAL | 0 refills | Status: DC

## 2021-09-26 NOTE — Telephone Encounter (Signed)
Requested Prescriptions   Signed Prescriptions Disp Refills   simvastatin (ZOCOR) 20 MG tablet 90 tablet 0    Sig: Take 1 tablet (20 mg total) by mouth at bedtime.    Authorizing Provider: END, CHRISTOPHER    Ordering User: Kendrick Fries

## 2021-09-26 NOTE — Telephone Encounter (Signed)
°*  STAT* If patient is at the pharmacy, call can be transferred to refill team.   1. Which medications need to be refilled? (please list name of each medication and dose if known) simvastatin 20 MG 1 tablet nightly  2. Which pharmacy/location (including street and city if local pharmacy) is medication to be sent to? [facility] in [place]   3. Do they need a 30 day or 90 day supply? 90 day

## 2021-10-07 ENCOUNTER — Other Ambulatory Visit: Payer: Self-pay | Admitting: Physician Assistant

## 2021-10-07 DIAGNOSIS — Z1231 Encounter for screening mammogram for malignant neoplasm of breast: Secondary | ICD-10-CM

## 2021-11-20 ENCOUNTER — Other Ambulatory Visit: Payer: Self-pay

## 2021-11-20 ENCOUNTER — Ambulatory Visit
Admission: RE | Admit: 2021-11-20 | Discharge: 2021-11-20 | Disposition: A | Discharge status: Home / Self Care | Payer: Medicare Other | Source: Ambulatory Visit | Attending: Physician Assistant | Admitting: Physician Assistant

## 2021-11-20 DIAGNOSIS — Z1231 Encounter for screening mammogram for malignant neoplasm of breast: Secondary | ICD-10-CM | POA: Insufficient documentation

## 2021-11-26 ENCOUNTER — Other Ambulatory Visit: Payer: Self-pay | Admitting: Physician Assistant

## 2021-11-26 DIAGNOSIS — N63 Unspecified lump in unspecified breast: Secondary | ICD-10-CM

## 2021-11-26 DIAGNOSIS — N6320 Unspecified lump in the left breast, unspecified quadrant: Secondary | ICD-10-CM

## 2021-11-26 DIAGNOSIS — R928 Other abnormal and inconclusive findings on diagnostic imaging of breast: Secondary | ICD-10-CM

## 2021-12-18 ENCOUNTER — Other Ambulatory Visit: Payer: Self-pay

## 2021-12-18 ENCOUNTER — Ambulatory Visit
Admission: RE | Admit: 2021-12-18 | Discharge: 2021-12-18 | Disposition: A | Discharge status: Home / Self Care | Payer: Medicare Other | Source: Ambulatory Visit | Attending: Physician Assistant | Admitting: Physician Assistant

## 2021-12-18 DIAGNOSIS — N63 Unspecified lump in unspecified breast: Secondary | ICD-10-CM

## 2021-12-18 DIAGNOSIS — R928 Other abnormal and inconclusive findings on diagnostic imaging of breast: Secondary | ICD-10-CM

## 2021-12-25 ENCOUNTER — Other Ambulatory Visit: Payer: Self-pay | Admitting: Internal Medicine

## 2021-12-26 ENCOUNTER — Telehealth: Payer: Self-pay | Admitting: Internal Medicine

## 2021-12-26 NOTE — Telephone Encounter (Signed)
?*  STAT* If patient is at the pharmacy, call can be transferred to refill team. ? ? ?1. Which medications need to be refilled? (please list name of each medication and dose if known) amLODipine (NORVASC) 5 MG tablet ? ?2. Which pharmacy/location (including street and city if local pharmacy) is medication to be sent to?[id number] - [place], [place] [facility] OF [address] ? ?3. Do they need a 30 day or 90 day supply? 90 day ? ?

## 2021-12-26 NOTE — Telephone Encounter (Signed)
amLODipine (NORVASC) 5 MG tablet 90 tablet 1 09/26/2021    ?Sig: TAKE 1 TABLET(5 MG) BY MOUTH DAILY   ?Sent to pharmacy as: amLODipine (NORVASC) 5 MG tablet   ?E-Prescribing Status: Receipt confirmed by pharmacy (09/26/2021 12:22 PM EST)   ?Renewals ? ?Renewal provider: Creig Hines, NP  ?   ? ?Pharmacy ? ?[id number] - [place], [place] - [address] AT [facility] OF [address]  ? ?

## 2021-12-27 ENCOUNTER — Other Ambulatory Visit: Payer: Self-pay | Admitting: Internal Medicine

## 2022-02-15 DIAGNOSIS — N200 Calculus of kidney: Secondary | ICD-10-CM | POA: Insufficient documentation

## 2022-02-16 NOTE — Progress Notes (Unsigned)
11/04/2018  9:22 PM   Tracey Mosley November 21, 1950 [id number]  Referring provider: Marinda Elk, MD [place] [facility] [place],  [place] [address]  Urological history: 1. High risk hematuria -non-smoker -cysto NED 2019  -CTU 01/2019 The adrenal glands appear normal. Stone within the inferior pole of left kidney measures 5 mm, image 32/2.  No right renal calculi. No hydronephrosis identified bilaterally. No kidney mass. Urinary bladder appears within normal limits -cysto, 2022 - NED  -no reports of gross heme -UA ***  2. Nephrolithiasis -stone composition of 47% calcium phosphate carbonate, 38% calcium oxalate dihydrate and 15% calcium oxalate monohydrate -ESWL x 3 -KUB 01/29/2021 5 mm left renal stone   No chief complaint on file.   HPI: Tracey Mosley is a 71 y.o. female who presents today for a yearly follow up.   UA ***  PMH: Past Medical History:  Diagnosis Date   Abnormal ECG    a. inferior Q's, poor R progression - dates back to 06/2014; b. 05/2017 MV: No ischemia/infarct.   History of echocardiogram    a. 04/2017 Echo: EF 55-60%, nl RV size/fxn.   Hyperlipidemia    Hypertension    Kidney stones    Osteoporosis    Thyroid disease     Surgical History: Past Surgical History:  Procedure Laterality Date   CHOLECYSTECTOMY     EXTRACORPOREAL SHOCK WAVE LITHOTRIPSY Left 09/09/2017   Procedure: EXTRACORPOREAL SHOCK WAVE LITHOTRIPSY (ESWL);  Surgeon: Abbie Sons, MD;  Location: [facility] OR[facility];  Service: Urology;  Laterality: Left;   EXTRACORPOREAL SHOCK WAVE LITHOTRIPSY Left 09/16/2017   Procedure: EXTRACORPOREAL SHOCK WAVE LITHOTRIPSY (ESWL);  Surgeon: Hollice Espy, MD;  Location: [facility] ORS;  Service: Urology;  Laterality: Left;   LITHOTRIPSY      Home Medications:  Allergies as of 02/17/2022       Reactions   Codeine Nausea Only   Codeine Sulfate Nausea Only   Epinephrine Other (See Comments)   Tremors   Sulfa Antibiotics Nausea  And Vomiting        Medication List        Accurate as of Feb 16, 2022  9:22 PM. If you have any questions, ask your nurse or doctor.          amLODipine 5 MG tablet Commonly known as: NORVASC TAKE 1 TABLET(5 MG) BY MOUTH DAILY   Cyanocobalamin 1000 MCG/ML Kit Inject as directed every 30 (thirty) days.   escitalopram 20 MG tablet Commonly known as: LEXAPRO TAKE 1 TABLET BY MOUTH ONCE DAILY   fluticasone 50 MCG/ACT nasal spray Commonly known as: FLONASE Place 1 spray into both nostrils daily.   glycopyrrolate 1 MG tablet Commonly known as: ROBINUL Take 1 mg by mouth 2 (two) times daily.   hydrochlorothiazide 25 MG tablet Commonly known as: HYDRODIURIL TAKE 1 TABLET BY MOUTH  DAILY   levothyroxine 50 MCG tablet Commonly known as: SYNTHROID Take 50 mcg by mouth daily before breakfast.   meloxicam 7.5 MG tablet Commonly known as: MOBIC Take 7.5 mg by mouth daily as needed for pain.   Multi-Vitamins Tabs Take 1 tablet by mouth daily.   nystatin cream Commonly known as: MYCOSTATIN Apply 1 application topically 2 (two) times daily as needed.   simvastatin 20 MG tablet Commonly known as: ZOCOR TAKE 1 TABLET(20 MG) BY MOUTH AT BEDTIME   valsartan 320 MG tablet Commonly known as: Diovan Take 1 tablet (320 mg total) by mouth daily.  Allergies:  Allergies  Allergen Reactions   Codeine Nausea Only   Codeine Sulfate Nausea Only   Epinephrine Other (See Comments)    Tremors   Sulfa Antibiotics Nausea And Vomiting    Family History: Family History  Problem Relation Age of Onset   Cancer Brother        Abdomina   Heart disease Mother        leaky valves   Heart attack Father 61   Aneurysm Father    Breast cancer Neg Hx     Social History:  reports that she has never smoked. She has never used smokeless tobacco. She reports current alcohol use of about 2.0 standard drinks per week. She reports that she does not use drugs.  ROS: For  pertinent review of systems please refer to history of present illness  Physical Exam: There were no vitals taken for this visit.  Constitutional:  Well nourished. Alert and oriented, No acute distress. HEENT: [place] AT, moist mucus membranes.  Trachea midline, no masses. Cardiovascular: No clubbing, cyanosis, or edema. Respiratory: Normal respiratory effort, no increased work of breathing. GI: Abdomen is soft, non tender, non distended, no abdominal masses. Liver and spleen not palpable.  No hernias appreciated.  Stool sample for occult testing is not indicated.   GU: No CVA tenderness.  No bladder fullness or masses.  *** external genitalia, *** pubic hair distribution, no lesions.  Normal urethral meatus, no lesions, no prolapse, no discharge.   No urethral masses, tenderness and/or tenderness. No bladder fullness, tenderness or masses. *** vagina mucosa, *** estrogen effect, no discharge, no lesions, *** pelvic support, *** cystocele and *** rectocele noted.  No cervical motion tenderness.  Uterus is freely mobile and non-fixed.  No adnexal/parametria masses or tenderness noted.  Anus and perineum are without rashes or lesions.   ***  Skin: No rashes, bruises or suspicious lesions. Lymph: No cervical or inguinal adenopathy. Neurologic: Grossly intact, no focal deficits, moving all 4 extremities. Psychiatric: Normal mood and affect.    Laboratory Data: WBC (White Blood Cell Count) 4.1 - 10.2 10^3/uL 7.2   RBC (Red Blood Cell Count) 4.04 - 5.48 10^6/uL 4.67   Hemoglobin 12.0 - 15.0 gm/dL 14.9   Hematocrit 35.0 - 47.0 % 44.3   MCV (Mean Corpuscular Volume) 80.0 - 100.0 fl 94.9   MCH (Mean Corpuscular Hemoglobin) 27.0 - 31.2 pg 31.9 High    MCHC (Mean Corpuscular Hemoglobin Concentration) 32.0 - 36.0 gm/dL 33.6   Platelet Count 150 - 450 10^3/uL 243   RDW-CV (Red Cell Distribution Width) 11.6 - 14.8 % 11.5 Low    MPV (Mean Platelet Volume) 9.4 - 12.4 fl 9.9   Neutrophils 1.50 - 7.80 10^3/uL  4.28   Lymphocytes 1.00 - 3.60 10^3/uL 2.39   Monocytes 0.00 - 1.50 10^3/uL 0.32   Eosinophils 0.00 - 0.55 10^3/uL 0.14   Basophils 0.00 - 0.09 10^3/uL 0.05   Neutrophil % 32.0 - 70.0 % 59.6   Lymphocyte % 10.0 - 50.0 % 33.2   Monocyte % 4.0 - 13.0 % 4.5   Eosinophil % 1.0 - 5.0 % 1.9   Basophil% 0.0 - 2.0 % 0.7   Immature Granulocyte % <=0.7 % 0.1   Immature Granulocyte Count <=0.06 10^3/L 0.01   Resulting Agency  [facility] - LAB  Specimen Collected: 10/15/21 09:21 Last Resulted: 10/15/21 09:49  Received From: [place]  Result Received: 12/17/21 07:44   Hemoglobin A1C 4.2 - 5.6 % 5.6  Average Blood Glucose (Calc) mg/dL 114   Resulting Agency  [facility]  Narrative Performed by [facility] - LAB Normal Range:    4.2 - 5.6%  Increased Risk:  5.7 - 6.4%  Diabetes:        >= 6.5%  Glycemic Control for adults with diabetes:  <7%   Specimen Collected: 10/15/21 09:21 Last Resulted: 10/15/21 10:48  Received From: [facility]  Result Received: 12/17/21 07:44   Color Yellow, Violet, Light Violet, Dark Violet Yellow   Clarity Clear, Other Clear   Specific Gravity 1.000 - 1.030 1.015   pH, Urine 5.0 - 8.0 7.0   Protein, Urinalysis Negative, Trace mg/dL Negative   Glucose, Urinalysis Negative mg/dL Negative   Ketones, Urinalysis Negative mg/dL Negative   Blood, Urinalysis Negative Trace Abnormal    Nitrite, Urinalysis Negative Negative   Leukocyte Esterase, Urinalysis Negative Negative   White Blood Cells, Urinalysis None Seen, 0-3 /hpf 0-3   Red Blood Cells, Urinalysis None Seen, 0-3 /hpf 0-3   Bacteria, Urinalysis None Seen /hpf None Seen   Squamous Epithelial Cells, Urinalysis Rare, Few, None Seen /hpf Rare   Resulting Agency  [facility] - LAB  Specimen Collected: 10/15/21 09:21 Last Resulted: 10/15/21 10:00  Received From: [facility]  Result Received: 12/17/21 07:44    Glucose 70 - 110 mg/dL 99   Sodium 136 - 145 mmol/L 140   Potassium 3.6 - 5.1 mmol/L 4.5   Chloride 97 - 109 mmol/L 104   Carbon Dioxide (CO2) 22.0 - 32.0 mmol/L 29.9   Urea Nitrogen (BUN) 7 - 25 mg/dL 22   Creatinine 0.6 - 1.1 mg/dL 0.9   Glomerular Filtration Rate (eGFR), MDRD Estimate >60 mL/min/1.73sq m 62   Calcium 8.7 - 10.3 mg/dL 9.7   AST  8 - 39 U/L 14   ALT  5 - 38 U/L 12   Alk Phos (alkaline Phosphatase) 34 - 104 U/L 56   Albumin 3.5 - 4.8 g/dL 4.2   Bilirubin, Total 0.3 - 1.2 mg/dL 1.2   Protein, Total 6.1 - 7.9 g/dL 6.8   A/G Ratio 1.0 - 5.0 gm/dL 1.6   Resulting Agency  [facility] - LAB  Specimen Collected: 10/15/21 09:21 Last Resulted: 10/15/21 10:58  Received From: [facility]  Result Received: 12/17/21 07:44   Urinalysis *** I have reviewed the labs.  Pertinent Imaging: N/A     Assessment & Plan:    1. High risk hematuria -non- smoker -CTU, 2020 - NED -cysto 2022 - NED -no reports of gross heme -UA ***  2. Left lower pole stone -***   No follow-ups on file.  Zara Council, PA-C  [facility] [address] [place], [place] [address] [phone]

## 2022-02-17 ENCOUNTER — Ambulatory Visit (INDEPENDENT_AMBULATORY_CARE_PROVIDER_SITE_OTHER): Payer: Medicare Other | Admitting: Urology

## 2022-02-17 ENCOUNTER — Encounter: Payer: Self-pay | Admitting: Urology

## 2022-02-17 VITALS — BP 130/81 | HR 76 | Ht 66.0 in | Wt 192.0 lb

## 2022-02-17 DIAGNOSIS — N200 Calculus of kidney: Secondary | ICD-10-CM | POA: Diagnosis not present

## 2022-02-17 DIAGNOSIS — R319 Hematuria, unspecified: Secondary | ICD-10-CM | POA: Diagnosis not present

## 2022-02-17 LAB — URINALYSIS, COMPLETE
Bilirubin, UA: NEGATIVE
Glucose, UA: NEGATIVE
Ketones, UA: NEGATIVE
Leukocytes,UA: NEGATIVE
Nitrite, UA: NEGATIVE
Protein,UA: NEGATIVE
Specific Gravity, UA: 1.015 (ref 1.005–1.030)
Urobilinogen, Ur: 1 mg/dL (ref 0.2–1.0)
pH, UA: 6.5 (ref 5.0–7.5)

## 2022-02-17 LAB — MICROSCOPIC EXAMINATION: Bacteria, UA: NONE SEEN

## 2022-04-23 ENCOUNTER — Encounter: Payer: Self-pay | Admitting: Internal Medicine

## 2022-04-23 ENCOUNTER — Ambulatory Visit (INDEPENDENT_AMBULATORY_CARE_PROVIDER_SITE_OTHER): Payer: Medicare Other | Admitting: Internal Medicine

## 2022-04-23 VITALS — BP 140/84 | HR 69 | Ht 66.0 in | Wt 188.0 lb

## 2022-04-23 DIAGNOSIS — E782 Mixed hyperlipidemia: Secondary | ICD-10-CM | POA: Diagnosis not present

## 2022-04-23 DIAGNOSIS — R0609 Other forms of dyspnea: Secondary | ICD-10-CM

## 2022-04-23 DIAGNOSIS — I10 Essential (primary) hypertension: Secondary | ICD-10-CM | POA: Diagnosis not present

## 2022-04-23 NOTE — Patient Instructions (Signed)
Medication Instructions:   Your physician recommends that you continue on your current medications as directed. Please refer to the Current Medication list given to you today.  *If you need a refill on your cardiac medications before your next appointment, please call your pharmacy*   Lab Work:  None ordered  Testing/Procedures:  None ordered   Follow-Up: At [facility], you and your health needs are our priority.  As part of our continuing mission to provide you with exceptional heart care, we have created designated Provider Care Teams.  These Care Teams include your primary Cardiologist (physician) and Advanced Practice Providers (APPs -  Physician Assistants and Nurse Practitioners) who all work together to provide you with the care you need, when you need it.  We recommend signing up for the patient portal called "MyChart".  Sign up information is provided on this After Visit Summary.  MyChart is used to connect with patients for Virtual Visits (Telemedicine).  Patients are able to view lab/test results, encounter notes, upcoming appointments, etc.  Non-urgent messages can be sent to your provider as well.   To learn more about what you can do with MyChart, go to [email or web address].    Your next appointment:   3 - 4 month(s)  The format for your next appointment:   In Person  Provider:   You may see [name], MD or one of the following Advanced Practice Providers on your designated Care Team:   Nicolasa Ducking, NP Eula Listen, PA-C Cadence Fransico Michael, PA-C{   Important Information About Sugar

## 2022-04-23 NOTE — Progress Notes (Signed)
Follow-up Outpatient Visit Date: 04/23/2022  Primary Care Provider: Marinda Elk, [place] [address]  Chief Complaint: Follow-up exertional dyspnea and hypertension  HPI:  [name] is a 71 y.o. female with history of hypertension, hyperlipidemia, hypothyroidism, chronic kidney disease stage III, and obesity, who presents for follow-up of dyspnea on exertion and hypertension.  I last saw her in 06/2021, at which time she was feeling fairly well.  She reported rare palpitations and occasional shortness of breath when walking quickly.  Due to suboptimal BP control, we agreed to increase amlodipine to 10 mg daily.  However, she did not tolerate this due to leg swelling.  Amlodipine was cut back to 5 mg daily.  Today, Tracey Mosley reports that she had a mechanical fall just over a week ago.  She injured her left knee.  It is starting to feel better though it is still swollen and pops when she moves it.  She plans to see orthopedics next week if it does not improve further in the meantime.  From a heart standpoint, she has been doing fairly well.  She feels like her exertional dyspnea is slightly worse than baseline, which she attributes to being sedentary over the last few months.  Palpitations are rare, happening only once or twice a month.  There have been no associated symptoms with the palpitations.  She notes 1 episode of pain in the left forearm that was both tight and sharp.  It happened at rest and resolved after 4-5 minutes.  There were no other symptoms at that time.  She happily reports that she has lost 15-20 pounds with weight watchers.  Her goal is to lose at least 40 pounds.  Leg edema has resolved since de-escalation of amlodipine to 5 mg daily.  She has not been checking her blood pressure in the last couple of weeks.  --------------------------------------------------------------------------------------------------  Cardiovascular  History & Procedures: Cardiovascular Problems: Hypertension Orthostatic lightheadedness Dyspnea on exertion   Risk Factors: Hypertension, hyperlipidemia, and obesity   Cath/PCI: None.   CV Surgery: None.   EP Procedures and Devices: None.   Non-Invasive Evaluation(s): Pharmacologic MPI (06/08/17): Low risk study without ischemia or scar. Normal LVEF. Renal artery Doppler (05/17/2017): No evidence of significant renal artery stenosis. TTE (05/18/17): Normal LV size and function. LVEF 55-60%. Normal RV size and function. No significant valvular abnormalities.  Recent CV Pertinent Labs: Lab Results  Component Value Date   K 3.4 (L) 04/30/2021   K 4.5 03/17/2014   BUN 25 (H) 04/30/2021   BUN 22 (H) 03/17/2014   CREATININE 1.01 (H) 04/30/2021   CREATININE 0.84 03/17/2014    Past medical and surgical history were reviewed and updated in EPIC.  Current Meds  Medication Sig   amLODipine (NORVASC) 5 MG tablet TAKE 1 TABLET(5 MG) BY MOUTH DAILY   Cyanocobalamin 1000 MCG/ML KIT Inject as directed every 30 (thirty) days.   escitalopram (LEXAPRO) 20 MG tablet TAKE 1 TABLET BY MOUTH ONCE DAILY   fluticasone (FLONASE) 50 MCG/ACT nasal spray Place 1 spray into both nostrils daily.    glycopyrrolate (ROBINUL) 1 MG tablet Take 1 mg by mouth 2 (two) times daily.   hydrochlorothiazide (HYDRODIURIL) 25 MG tablet TAKE 1 TABLET BY MOUTH  DAILY   levothyroxine (SYNTHROID, LEVOTHROID) 50 MCG tablet Take 50 mcg by mouth daily before breakfast.    meloxicam (MOBIC) 7.5 MG tablet Take 7.5 mg by mouth daily as needed for pain.  Multiple Vitamin (MULTI-VITAMINS) TABS Take 1 tablet by mouth daily.   simvastatin (ZOCOR) 20 MG tablet TAKE 1 TABLET(20 MG) BY MOUTH AT BEDTIME    Allergies: Codeine, Codeine sulfate, Epinephrine, and Sulfa antibiotics  Social History   Tobacco Use   Smoking status: Never   Smokeless tobacco: Never  Vaping Use   Vaping Use: Never used  Substance Use Topics    Alcohol use: Yes    Alcohol/week: 2.0 standard drinks of alcohol    Types: 2 Cans of beer per week    Comment: once every 3-4 months   Drug use: No    Family History  Problem Relation Age of Onset   Cancer Brother        Abdomina   Heart disease Mother        leaky valves   Heart attack Father 70   Aneurysm Father    Breast cancer Neg Hx     Review of Systems: A 12-system review of systems was performed and was negative except as noted in the HPI.  --------------------------------------------------------------------------------------------------  Physical Exam: BP 140/84 (BP Location: Left Arm, Patient Position: Sitting, Cuff Size: Large)   Pulse 69   Ht '5\' 6"'  (1.676 m)   Wt 188 lb (85.3 kg)   SpO2 97%   BMI 30.34 kg/m   General:  NAD. Neck: No JVD or HJR. Lungs: Clear to auscultation bilaterally without wheezes or crackles. Heart: Regular rate and rhythm without murmurs, rubs, or gallops. Abdomen: Soft, nontender, nondistended. Extremities: No lower extremity edema.  Left knee appears a little swollen.  EKG: Normal sinus rhythm with poor R wave progression and inferior Q waves.  No significant change from prior tracing on 04/16/2021 and dating back as far as 05/06/2017.  Outside labs (10/15/2021, [facility]): Lipid panel: Total cholesterol 195, triglycerides 162, HDL 45, LDL 118  CMP: Sodium 140, potassium 4.5, chloride 104, CO2 30, BUN 22, creatinine 0.9, glucose 99, calcium 9.7, AST 14, ALT 12, alkaline phosphatase 56, total bilirubin 1.2, total protein 6.8, albumin 4.2  CBC: WBC 7.2, Hgb 14.9, HCT 44.3, PLT 243  TSH (10/28/2021): 4.25  --------------------------------------------------------------------------------------------------  ASSESSMENT AND PLAN: Dyspnea on exertion: Tracey Mosley reports more exertional fatigue and dyspnea over the last several months in the setting of having been sedentary.  Her most recent echo and stress test were in 2018.  We  discussed repeating these tests but have agreed to defer them in favor of increasing her activity.  If her dyspnea and fatigue do not improve, we would need to reconsider echo and ischemia evaluation.  Hypertension: Blood pressure mildly elevated today.  Home blood pressure readings are similar or slightly lower.  Recent pain and anxiety surrounding her mechanical fall and left knee pain may be contributing.  Tracey Mosley did not tolerate escalation of amlodipine due to leg edema.  We have agreed to defer medication changes today and instead focus on lifestyle modifications.  Hyperlipidemia: Most recent lipid panel in November notable for mildly elevated triglycerides and LDL at 162 and 118, respectively.  We will continue simvastatin for now though escalation to more aggressive statin therapy should be considered at Dr. Verne Spurr discretion.  I congratulated Tracey Mosley on her weight loss thus far and encouraged her to continue with this.  Follow-up: Return to clinic in 3-4 months.  Nelva Bush, MD 04/23/2022 9:00 AM

## 2022-04-24 ENCOUNTER — Encounter: Payer: Self-pay | Admitting: Internal Medicine

## 2022-04-24 ENCOUNTER — Other Ambulatory Visit: Payer: Self-pay

## 2022-04-24 MED ORDER — VALSARTAN 320 MG PO TABS: 320.0000 mg | ORAL_TABLET | Freq: Every day | ORAL | 0 refills | Status: DC

## 2022-05-12 DIAGNOSIS — S8392XA Sprain of unspecified site of left knee, initial encounter: Secondary | ICD-10-CM | POA: Insufficient documentation

## 2022-05-12 DIAGNOSIS — M25562 Pain in left knee: Secondary | ICD-10-CM | POA: Insufficient documentation

## 2022-06-24 ENCOUNTER — Telehealth: Payer: Self-pay | Admitting: Internal Medicine

## 2022-06-24 MED ORDER — SIMVASTATIN 20 MG PO TABS: ORAL_TABLET | ORAL | 0 refills | Status: DC

## 2022-06-24 NOTE — Telephone Encounter (Signed)
 *  STAT* If patient is at the pharmacy, call can be transferred to refill team.   1. Which medications need to be refilled? (please list name of each medication and dose if known) simvastatin (ZOCOR) 20 MG tablet  2. Which pharmacy/location (including street and city if local pharmacy) is medication to be sent to? [facility] [place], [address] AT [facility] OF [address] [place]  3. Do they need a 30 day or 90 day supply? 90 day

## 2022-07-08 ENCOUNTER — Telehealth: Payer: Self-pay | Admitting: Internal Medicine

## 2022-07-08 MED ORDER — AMLODIPINE BESYLATE 5 MG PO TABS
ORAL_TABLET | ORAL | 1 refills | Status: DC
Start: 2022-07-08 — End: 2022-10-12

## 2022-07-08 NOTE — Telephone Encounter (Signed)
*  STAT* If patient is at the pharmacy, call can be transferred to refill team.   1. Which medications need to be refilled? (please list name of each medication and dose if known)  new prescription for Amlodipine 5 mg  2. Which pharmacy/location (including street and city if local pharmacy) is medication to be sent to? [place]  3. Do they need a 30 day or 90 day supply? 90 days amd refills

## 2022-07-18 ENCOUNTER — Other Ambulatory Visit: Payer: Self-pay | Admitting: Internal Medicine

## 2022-07-29 ENCOUNTER — Ambulatory Visit: Payer: Medicare Other | Admitting: Internal Medicine

## 2022-08-24 NOTE — Progress Notes (Unsigned)
11/04/2018  12:51 PM   Tracey Mosley 03-Mar-1951 [id number]  Referring provider: Marinda Elk, MD [facility] [place],  [place] [address]  Urological history: 1. High risk hematuria -non-smoker -cysto NED 2019  -CTU 01/2019 The adrenal glands appear normal. Stone within the inferior pole of left kidney measures 5 mm, image 32/2.  No right renal calculi. No hydronephrosis identified bilaterally. No kidney mass. Urinary bladder appears within normal limits -cysto, 2022 - NED  -no reports of gross heme -UA 04/2022) negative for micro heme  2. Nephrolithiasis -stone composition of 47% calcium phosphate carbonate, 38% calcium oxalate dihydrate and 15% calcium oxalate monohydrate -ESWL x 3 -KUB ***  No chief complaint on file.   HPI: Tracey Mosley is a 71 y.o. female who presents today for a yearly follow up.   KUB ***  PMH: Past Medical History:  Diagnosis Date   Abnormal ECG    a. inferior Q's, poor R progression - dates back to 06/2014; b. 05/2017 MV: No ischemia/infarct.   History of echocardiogram    a. 04/2017 Echo: EF 55-60%, nl RV size/fxn.   Hyperlipidemia    Hypertension    Kidney stones    Osteoporosis    Thyroid disease     Surgical History: Past Surgical History:  Procedure Laterality Date   CHOLECYSTECTOMY     EXTRACORPOREAL SHOCK WAVE LITHOTRIPSY Left 09/09/2017   Procedure: EXTRACORPOREAL SHOCK WAVE LITHOTRIPSY (ESWL);  Surgeon: Abbie Sons, MD;  Location: [facility] ORS;  Service: Urology;  Laterality: Left;   EXTRACORPOREAL SHOCK WAVE LITHOTRIPSY Left 09/16/2017   Procedure: EXTRACORPOREAL SHOCK WAVE LITHOTRIPSY (ESWL);  Surgeon: Hollice Espy, MD;  Location: [facility] ORS;  Service: Urology;  Laterality: Left;   LITHOTRIPSY      Home Medications:  Allergies as of 08/25/2022       Reactions   Codeine Nausea Only   Codeine Sulfate Nausea Only   Epinephrine Other (See Comments)   Tremors   Sulfa Antibiotics Nausea  And Vomiting        Medication List        Accurate as of August 24, 2022 12:51 PM. If you have any questions, ask your nurse or doctor.          amLODipine 5 MG tablet Commonly known as: NORVASC TAKE 1 TABLET(5 MG) BY MOUTH DAILY   Cyanocobalamin 1000 MCG/ML Kit Inject as directed every 30 (thirty) days.   escitalopram 20 MG tablet Commonly known as: LEXAPRO TAKE 1 TABLET BY MOUTH ONCE DAILY   fluticasone 50 MCG/ACT nasal spray Commonly known as: FLONASE Place 1 spray into both nostrils daily.   glycopyrrolate 1 MG tablet Commonly known as: ROBINUL Take 1 mg by mouth 2 (two) times daily.   hydrochlorothiazide 25 MG tablet Commonly known as: HYDRODIURIL TAKE 1 TABLET BY MOUTH  DAILY   levothyroxine 50 MCG tablet Commonly known as: SYNTHROID Take 50 mcg by mouth daily before breakfast.   meloxicam 7.5 MG tablet Commonly known as: MOBIC Take 7.5 mg by mouth daily as needed for pain.   Multi-Vitamins Tabs Take 1 tablet by mouth daily.   simvastatin 20 MG tablet Commonly known as: ZOCOR TAKE 1 TABLET(20 MG) BY MOUTH AT BEDTIME   valsartan 320 MG tablet Commonly known as: DIOVAN TAKE 1 TABLET(320 MG) BY MOUTH DAILY        Allergies:  Allergies  Allergen Reactions   Codeine Nausea Only   Codeine Sulfate Nausea Only   Epinephrine  Other (See Comments)    Tremors   Sulfa Antibiotics Nausea And Vomiting    Family History: Family History  Problem Relation Age of Onset   Cancer Brother        Abdomina   Heart disease Mother        leaky valves   Heart attack Father 68   Aneurysm Father    Breast cancer Neg Hx     Social History:  reports that she has never smoked. She has never used smokeless tobacco. She reports current alcohol use of about 2.0 standard drinks of alcohol per week. She reports that she does not use drugs.  ROS: For pertinent review of systems please refer to history of present illness  Physical Exam: There were no vitals  taken for this visit.  Constitutional:  Well nourished. Alert and oriented, No acute distress. HEENT: [place] AT, moist mucus membranes.  Trachea midline, no masses. Cardiovascular: No clubbing, cyanosis, or edema. Respiratory: Normal respiratory effort, no increased work of breathing. GU: No CVA tenderness.  No bladder fullness or masses. Vulvovaginal atrophy w/ pallor, loss of rugae, introital retraction, excoriations.  Vulvar thinning, fusion of labia, clitoral hood retraction, prominent urethral meatus.   *** external genitalia, *** pubic hair distribution, no lesions.  Normal urethral meatus, no lesions, no prolapse, no discharge.   No urethral masses, tenderness and/or tenderness. No bladder fullness, tenderness or masses. *** vagina mucosa, *** estrogen effect, no discharge, no lesions, *** pelvic support, *** cystocele and *** rectocele noted.  No cervical motion tenderness.  Uterus is freely mobile and non-fixed.  No adnexal/parametria masses or tenderness noted.  Anus and perineum are without rashes or lesions.   ***  Neurologic: Grossly intact, no focal deficits, moving all 4 extremities. Psychiatric: Normal mood and affect.    Laboratory Data: Serum creatinine (04/2022) 0.9 Hemoglobin A1c (04/2022) 5.6% Urinalysis Color Yellow, Violet, Light Violet, Dark Violet Yellow  Clarity Clear, Other Clear  Specific Gravity 1.000 - 1.030 1.020  pH, Urine 5.0 - 8.0 6.0  Protein, Urinalysis Negative, Trace mg/dL Negative  Glucose, Urinalysis Negative mg/dL Negative  Ketones, Urinalysis Negative mg/dL Negative  Blood, Urinalysis Negative Negative  Nitrite, Urinalysis Negative Negative  Leukocyte Esterase, Urinalysis Negative Negative  White Blood Cells, Urinalysis None Seen, 0-3 /hpf 0-3  Red Blood Cells, Urinalysis None Seen, 0-3 /hpf None Seen  Bacteria, Urinalysis None Seen /hpf Rare Abnormal   Squamous Epithelial Cells, Urinalysis Rare, Few, None Seen /hpf Rare  Resulting Agency  [facility]   Specimen Collected: 04/29/22 09:04   Performed by: [facility] Last Resulted: 04/29/22 09:57  Received From: [facility]  Result Received: 06/24/22 11:48  I have reviewed the labs.  Pertinent Imaging: *** I have independently reviewed the films.  See HPI.     Assessment & Plan:    1. High risk hematuria -non- smoker -CTU, 2020 - NED -cysto 2022 - NED -no reports of gross heme -UA neg for micro heme   2. Left lower pole stone - no passage of fragment - no renal colic   No follow-ups on file.  Jeremiyah Cullens, [place] [address] [place], [place] [address] [phone]

## 2022-08-25 ENCOUNTER — Other Ambulatory Visit: Payer: Self-pay

## 2022-08-25 ENCOUNTER — Ambulatory Visit
Admission: RE | Admit: 2022-08-25 | Discharge: 2022-08-25 | Disposition: A | Discharge status: Home / Self Care | Payer: Medicare Other | Source: Ambulatory Visit | Attending: Urology | Admitting: Urology

## 2022-08-25 ENCOUNTER — Ambulatory Visit (INDEPENDENT_AMBULATORY_CARE_PROVIDER_SITE_OTHER): Payer: Medicare Other | Admitting: Urology

## 2022-08-25 ENCOUNTER — Encounter: Payer: Self-pay | Admitting: Urology

## 2022-08-25 VITALS — BP 161/82 | HR 77 | Ht 66.0 in | Wt 182.0 lb

## 2022-08-25 DIAGNOSIS — N200 Calculus of kidney: Secondary | ICD-10-CM | POA: Diagnosis present

## 2022-08-25 DIAGNOSIS — N393 Stress incontinence (female) (male): Secondary | ICD-10-CM

## 2022-08-25 DIAGNOSIS — R319 Hematuria, unspecified: Secondary | ICD-10-CM

## 2022-09-03 ENCOUNTER — Ambulatory Visit: Payer: Medicare Other | Attending: Internal Medicine | Admitting: Internal Medicine

## 2022-09-03 ENCOUNTER — Encounter: Payer: Self-pay | Admitting: Internal Medicine

## 2022-09-03 VITALS — BP 140/80 | HR 90 | Ht 66.0 in | Wt 178.0 lb

## 2022-09-03 DIAGNOSIS — I10 Essential (primary) hypertension: Secondary | ICD-10-CM | POA: Insufficient documentation

## 2022-09-03 DIAGNOSIS — E782 Mixed hyperlipidemia: Secondary | ICD-10-CM | POA: Insufficient documentation

## 2022-09-03 DIAGNOSIS — J209 Acute bronchitis, unspecified: Secondary | ICD-10-CM | POA: Insufficient documentation

## 2022-09-03 DIAGNOSIS — R0602 Shortness of breath: Secondary | ICD-10-CM | POA: Diagnosis not present

## 2022-09-03 NOTE — Progress Notes (Signed)
Follow-up Outpatient Visit Date: 09/03/2022  Primary Care Provider: Marinda Elk, [place] [address]  Chief Complaint: Cough and shortness of breath  HPI:  Ms. Wildes is a 71 y.o. female with history of hypertension, hyperlipidemia, hypothyroidism, chronic kidney disease stage III, and obesity, who presents for follow-up of dyspnea on exertion and hypertension.  I last saw her in July, at which time she was recovering from a mechanical fall leading to a left knee injury.  Chronic exertional dyspnea was slightly worse than at prior visits, which Ms. Mcgough attributed to having been sedentary for several months.  She had lost some weight with weight watchers.  We discussed repeating echo and ischemia evaluation but agreed to defer these.  Today, Ms. Norgard reports that she has been struggling with bronchitis for the last 3 weeks.  She is about to begin another round of antibiotics and prednisone after no improvement with a first course of treatment.  She has also been prescribed an inhaler by her PCP.  She has felt somewhat short of breath with bronchitis as well as fatigue.  She notes some chest discomfort when coughing but otherwise has not had any chest pain.  Her exertional dyspnea was stable prior to contracting bronchitis.  She reports having had a chest radiograph yesterday through Dr. Verne Spurr office, though she has not received any results yet.  She denies palpitations, lightheadedness, orthopnea, and edema.  --------------------------------------------------------------------------------------------------  Cardiovascular History & Procedures: Cardiovascular Problems: Hypertension Orthostatic lightheadedness Dyspnea on exertion   Risk Factors: Hypertension, hyperlipidemia, and obesity   Cath/PCI: None.   CV Surgery: None.   EP Procedures and Devices: None.   Non-Invasive Evaluation(s): Pharmacologic MPI (06/08/17):  Low risk study without ischemia or scar. Normal LVEF. Renal artery Doppler (05/17/2017): No evidence of significant renal artery stenosis. TTE (05/18/17): Normal LV size and function. LVEF 55-60%. Normal RV size and function. No significant valvular abnormalities.  Recent CV Pertinent Labs: Lab Results  Component Value Date   K 3.4 (L) 04/30/2021   K 4.5 03/17/2014   BUN 25 (H) 04/30/2021   BUN 22 (H) 03/17/2014   CREATININE 1.01 (H) 04/30/2021   CREATININE 0.84 03/17/2014    Past medical and surgical history were reviewed and updated in EPIC.  Current Meds  Medication Sig   albuterol (VENTOLIN HFA) 108 (90 Base) MCG/ACT inhaler Inhale 1-2 puffs into the lungs every 4 (four) hours as needed.   amLODipine (NORVASC) 5 MG tablet TAKE 1 TABLET(5 MG) BY MOUTH DAILY   Cyanocobalamin 1000 MCG/ML KIT Inject as directed every 30 (thirty) days.   escitalopram (LEXAPRO) 20 MG tablet TAKE 1 TABLET BY MOUTH ONCE DAILY   fluticasone (FLONASE) 50 MCG/ACT nasal spray Place 1 spray into both nostrils daily.    glycopyrrolate (ROBINUL) 1 MG tablet Take 1 mg by mouth 2 (two) times daily.   hydrochlorothiazide (HYDRODIURIL) 25 MG tablet TAKE 1 TABLET BY MOUTH  DAILY   levothyroxine (SYNTHROID, LEVOTHROID) 50 MCG tablet Take 50 mcg by mouth daily before breakfast.    meloxicam (MOBIC) 7.5 MG tablet Take 7.5 mg by mouth daily as needed for pain.   Multiple Vitamin (MULTI-VITAMINS) TABS Take 1 tablet by mouth daily.   promethazine-dextromethorphan (PROMETHAZINE-DM) 6.25-15 MG/5ML syrup Take 5 mLs by mouth 4 (four) times daily as needed.   simvastatin (ZOCOR) 20 MG tablet TAKE 1 TABLET(20 MG) BY MOUTH AT BEDTIME   valsartan (DIOVAN) 320 MG tablet TAKE 1 TABLET(320 MG) BY  MOUTH DAILY    Allergies: Codeine, Codeine sulfate, Epinephrine, and Sulfa antibiotics  Social History   Tobacco Use   Smoking status: Never   Smokeless tobacco: Never  Vaping Use   Vaping Use: Never used  Substance Use Topics    Alcohol use: Yes    Alcohol/week: 2.0 standard drinks of alcohol    Types: 2 Cans of beer per week    Comment: once every 3-4 months   Drug use: No    Family History  Problem Relation Age of Onset   Cancer Brother        Abdomina   Heart disease Mother        leaky valves   Heart attack Father 54   Aneurysm Father    Breast cancer Neg Hx     Review of Systems: A 12-system review of systems was performed and was negative except as noted in the HPI.  --------------------------------------------------------------------------------------------------  Physical Exam: BP (!) 140/80 (BP Location: Left Arm, Patient Position: Sitting, Cuff Size: Large)   Pulse 90   Ht _0  (1.676 m)   Wt 178 lb (80.7 kg)   SpO2 97%   BMI 28.73 kg/m   General:  NAD. Neck: No JVD or HJR. Lungs: Coarse breath sounds with scattered faint rhonchi.  No crackles. Heart: Regular rate and rhythm without murmurs, rubs, or gallops. Abdomen: Soft, nontender, nondistended. Extremities: No lower extremity edema.   Lab Results  Component Value Date   WBC 9.1 03/17/2014   HGB 14.1 03/17/2014   HCT 42.0 03/17/2014   MCV 94 03/17/2014   PLT 206 03/17/2014    Lab Results  Component Value Date   NA 139 04/30/2021   K 3.4 (L) 04/30/2021   CL 103 04/30/2021   CO2 25 04/30/2021   BUN 25 (H) 04/30/2021   CREATININE 1.01 (H) 04/30/2021   GLUCOSE 102 (H) 04/30/2021   ALT 25 05/06/2017    No results found for: "CHOL", "HDL", "LDLCALC", "LDLDIRECT", "TRIG", "CHOLHDL"  --------------------------------------------------------------------------------------------------  ASSESSMENT AND PLAN: Acute bronchitis and shortness of breath: Exertional dyspnea was stable up until recent acute respiratory illness, currently being treated by Dr. Carrie Mew.  We will defer medication changes today.  However, I have asked Ms. Trager to reach out to [place] if her symptoms do not improve over the next week with additional  round of antibiotics and corticosteroids.  She does not appear to be in overt heart failure today, though we would need to consider checking a BNP and an echocardiogram if her symptoms do not improve with bronchitis treatment.  Hypertension: Blood pressure borderline elevated today, likely exacerbated by acute illness and ongoing corticosteroid use.  We will defer medication changes today.  Hyperlipidemia: LDL suboptimally controlled at 133 on last check in August.  Ms. Catanese remains on simvastatin 20 mg daily.  We will defer medication changes today, though escalation to higher intensity statin therapy to target an LDL at or below 100 should be considered in the future.  Follow-up: Return to clinic in 6 weeks.  Nelva Bush, MD 09/03/2022 10:33 AM

## 2022-09-03 NOTE — Patient Instructions (Signed)
Medication Instructions:  Your Physician recommend you continue on your current medication as directed.    *If you need a refill on your cardiac medications before your next appointment, please call your pharmacy*   Lab Work: None ordered today   Testing/Procedures: None ordered today   Follow-Up: At [facility], you and your health needs are our priority.  As part of our continuing mission to provide you with exceptional heart care, we have created designated Provider Care Teams.  These Care Teams include your primary Cardiologist (physician) and Advanced Practice Providers (APPs -  Physician Assistants and Nurse Practitioners) who all work together to provide you with the care you need, when you need it.  We recommend signing up for the patient portal called "MyChart".  Sign up information is provided on this After Visit Summary.  MyChart is used to connect with patients for Virtual Visits (Telemedicine).  Patients are able to view lab/test results, encounter notes, upcoming appointments, etc.  Non-urgent messages can be sent to your provider as well.   To learn more about what you can do with MyChart, go to [email or web address].    Your next appointment:   6 week(s)  The format for your next appointment:   In Person  Provider:   You may see [name], MD or one of the following Advanced Practice Providers on your designated Care Team:   Nicolasa Ducking, NP Eula Listen, PA-C Cadence Fransico Michael, PA-C Charlsie Quest, NP

## 2022-09-04 ENCOUNTER — Encounter: Payer: Self-pay | Admitting: Internal Medicine

## 2022-09-04 DIAGNOSIS — J209 Acute bronchitis, unspecified: Secondary | ICD-10-CM | POA: Insufficient documentation

## 2022-09-23 ENCOUNTER — Other Ambulatory Visit: Payer: Self-pay | Admitting: Internal Medicine

## 2022-09-24 ENCOUNTER — Other Ambulatory Visit: Payer: Self-pay | Admitting: Internal Medicine

## 2022-10-12 ENCOUNTER — Other Ambulatory Visit: Payer: Self-pay | Admitting: Internal Medicine

## 2022-10-13 ENCOUNTER — Other Ambulatory Visit: Payer: Self-pay | Admitting: Internal Medicine

## 2022-10-14 NOTE — Progress Notes (Signed)
Follow-up Outpatient Visit Date: 10/15/2022  Primary Care Provider: Marinda Elk, [place] [address]  Chief Complaint: Shortness of breath  HPI:  Ms. Buske is a 72 y.o. female with history of hypertension, hyperlipidemia, hypothyroidism, chronic kidney disease stage 3, and obesity, who presents for follow-up of dyspnea on exertion and hypertension.  I last saw her in early December, which time she was struggling with bronchitis over the preceding 3 weeks.  She noted some chest discomfort when coughing.  We did not make any medication changes or pursue additional testing.  Today, Ms. Ebrahimi reports that she is feeling better than at our last visit, though it was not until the week before Christmas that her bronchitis resolved.  This was followed by a bout of laryngitis, though this is better as well.  She has noticed some continued shortness of breath from time to time, including when walking into the office today.  She describes her dyspnea as if she is holding her breath more than she should.  She also notes occasional palpitations that come and go, described as a "big gulp" followed by a few fast beats.  There are no associated symptoms though at other times she has mild orthostatic lightheadedness.  She denies chest pain and edema.  Home blood pressure is typically in the 130s over 80s.  She is not exercising regularly.  --------------------------------------------------------------------------------------------------  Cardiovascular History & Procedures: Cardiovascular Problems: Hypertension Orthostatic lightheadedness Dyspnea on exertion   Risk Factors: Hypertension, hyperlipidemia, and obesity   Cath/PCI: None.   CV Surgery: None.   EP Procedures and Devices: None.   Non-Invasive Evaluation(s): Pharmacologic MPI (06/08/17): Low risk study without ischemia or scar. Normal LVEF. Renal artery Doppler (05/17/2017): No  evidence of significant renal artery stenosis. TTE (05/18/17): Normal LV size and function. LVEF 55-60%. Normal RV size and function. No significant valvular abnormalities.  Recent CV Pertinent Labs: Lab Results  Component Value Date   K 3.4 (L) 04/30/2021   K 4.5 03/17/2014   BUN 25 (H) 04/30/2021   BUN 22 (H) 03/17/2014   CREATININE 1.01 (H) 04/30/2021   CREATININE 0.84 03/17/2014    Past medical and surgical history were reviewed and updated in EPIC.  Current Meds  Medication Sig   albuterol (VENTOLIN HFA) 108 (90 Base) MCG/ACT inhaler Inhale 1-2 puffs into the lungs every 4 (four) hours as needed.   amLODipine (NORVASC) 5 MG tablet TAKE 1 TABLET(5 MG) BY MOUTH DAILY   Cyanocobalamin 1000 MCG/ML KIT Inject as directed every 30 (thirty) days.   escitalopram (LEXAPRO) 20 MG tablet TAKE 1 TABLET BY MOUTH ONCE DAILY   fluticasone (FLONASE) 50 MCG/ACT nasal spray Place 1 spray into both nostrils daily.    glycopyrrolate (ROBINUL) 1 MG tablet Take 1 mg by mouth 2 (two) times daily.   hydrochlorothiazide (HYDRODIURIL) 25 MG tablet TAKE 1 TABLET BY MOUTH  DAILY   levothyroxine (SYNTHROID, LEVOTHROID) 50 MCG tablet Take 50 mcg by mouth daily before breakfast.    meloxicam (MOBIC) 7.5 MG tablet Take 7.5 mg by mouth daily as needed for pain.   Multiple Vitamin (MULTI-VITAMINS) TABS Take 1 tablet by mouth daily.   simvastatin (ZOCOR) 20 MG tablet TAKE 1 TABLET(20 MG) BY MOUTH AT BEDTIME   valsartan (DIOVAN) 320 MG tablet TAKE 1 TABLET(320 MG) BY MOUTH DAILY    Allergies: Codeine, Codeine sulfate, Epinephrine, and Sulfa antibiotics  Social History   Tobacco Use   Smoking status: Never  Smokeless tobacco: Never  Vaping Use   Vaping Use: Never used  Substance Use Topics   Alcohol use: Yes    Alcohol/week: 2.0 standard drinks of alcohol    Types: 2 Cans of beer per week    Comment: once every 3-4 months   Drug use: No    Family History  Problem Relation Age of Onset   Cancer  Brother        Abdomina   Heart disease Mother        leaky valves   Heart attack Father 58   Aneurysm Father    Breast cancer Neg Hx     Review of Systems: A 12-system review of systems was performed and was negative except as noted in the HPI.  --------------------------------------------------------------------------------------------------  Physical Exam: BP 120/80 (BP Location: Left Arm, Patient Position: Sitting, Cuff Size: Large)   Pulse 77   Ht 5\' 6"  (1.676 m)   Wt 183 lb (83 kg)   SpO2 98%   BMI 29.54 kg/m   General:  NAD. Neck: No JVD or HJR. Lungs: Clear to auscultation bilaterally without wheezes or crackles. Heart: Regular rate and rhythm without murmurs, rubs, or gallops. Abdomen: Soft, nontender, nondistended. Extremities: No lower extremity edema.  EKG:  Normal sinus rhythm with poor R wave progression and inferior infarct.  No significant change from prior tracing on 04/23/2022 and similar to EKGs dating back to 2018.  Lab Results  Component Value Date   WBC 9.1 03/17/2014   HGB 14.1 03/17/2014   HCT 42.0 03/17/2014   MCV 94 03/17/2014   PLT 206 03/17/2014    Lab Results  Component Value Date   NA 139 04/30/2021   K 3.4 (L) 04/30/2021   CL 103 04/30/2021   CO2 25 04/30/2021   BUN 25 (H) 04/30/2021   CREATININE 1.01 (H) 04/30/2021   GLUCOSE 102 (H) 04/30/2021   ALT 25 05/06/2017    No results found for: "CHOL", "HDL", "LDLCALC", "LDLDIRECT", "TRIG", "CHOLHDL"  --------------------------------------------------------------------------------------------------  ASSESSMENT AND PLAN: Shortness of breath: Overall, Ms. Devery is feeling better compared to our visit in early December when she was still recovering from bronchitis.  However, she still has some exertional dyspnea.  Last echocardiogram in 2018 was unremarkable.  We discussed repeating an echocardiogram but have agreed to defer this in favor of Ms. Kauzlarich trying to increase her activity,  as she believes that deconditioning is playing a role in her exertional dyspnea.  I advised her to let us know if her dyspnea and exercise tolerance do not improve as she continues to exercise.  This would prompt [place] to repeat echocardiogram +/- ischemia evaluation (echo and MPI in 2018 were both unremarkable).  Pretension: Blood pressure upper normal today.  We will defer medication changes at this time.  Hyperlipidemia: Most recent lipid panel through Dr. Verne Spurr office in 04/2022 was above goal with LDL of 133.  Given absence of established ASCVD, we will continue with current dose of simvastatin.  I encouraged Ms. Garr to work on lifestyle modifications.  Ongoing follow-up per Dr. Carrie Mew.  Follow-up: Return to clinic in 3 months.  Nelva Bush, MD 10/15/2022 9:38 AM

## 2022-10-15 ENCOUNTER — Encounter: Payer: Self-pay | Admitting: Internal Medicine

## 2022-10-15 ENCOUNTER — Ambulatory Visit: Payer: Medicare Other | Attending: Internal Medicine | Admitting: Internal Medicine

## 2022-10-15 VITALS — BP 120/80 | HR 77 | Ht 66.0 in | Wt 183.0 lb

## 2022-10-15 DIAGNOSIS — I10 Essential (primary) hypertension: Secondary | ICD-10-CM | POA: Insufficient documentation

## 2022-10-15 DIAGNOSIS — R0602 Shortness of breath: Secondary | ICD-10-CM

## 2022-10-15 DIAGNOSIS — E782 Mixed hyperlipidemia: Secondary | ICD-10-CM | POA: Insufficient documentation

## 2022-10-15 NOTE — Patient Instructions (Signed)
Medication Instructions:  Your Physician recommend you continue on your current medication as directed.    *If you need a refill on your cardiac medications before your next appointment, please call your pharmacy*   Lab Work: None ordered today   Testing/Procedures: None ordered today   Follow-Up: At [facility], you and your health needs are our priority.  As part of our continuing mission to provide you with exceptional heart care, we have created designated Provider Care Teams.  These Care Teams include your primary Cardiologist (physician) and Advanced Practice Providers (APPs -  Physician Assistants and Nurse Practitioners) who all work together to provide you with the care you need, when you need it.  We recommend signing up for the patient portal called "MyChart".  Sign up information is provided on this After Visit Summary.  MyChart is used to connect with patients for Virtual Visits (Telemedicine).  Patients are able to view lab/test results, encounter notes, upcoming appointments, etc.  Non-urgent messages can be sent to your provider as well.   To learn more about what you can do with MyChart, go to [email or web address].    Your next appointment:   3 month(s)  Provider:   You may see Christopher End, MD or one of the following Advanced Practice Providers on your designated Care Team:   Christopher Berge, NP Ryan Dunn, PA-C Cadence Furth, PA-C Sheri Hammock, NP     

## 2022-10-16 ENCOUNTER — Encounter: Payer: Self-pay | Admitting: Internal Medicine

## 2022-10-19 ENCOUNTER — Other Ambulatory Visit: Payer: Self-pay | Admitting: Physician Assistant

## 2022-10-19 DIAGNOSIS — Z1231 Encounter for screening mammogram for malignant neoplasm of breast: Secondary | ICD-10-CM

## 2022-12-23 ENCOUNTER — Ambulatory Visit
Admission: RE | Admit: 2022-12-23 | Discharge: 2022-12-23 | Disposition: A | Discharge status: Home / Self Care | Payer: Medicare Other | Source: Ambulatory Visit | Attending: Physician Assistant | Admitting: Physician Assistant

## 2022-12-23 DIAGNOSIS — Z1231 Encounter for screening mammogram for malignant neoplasm of breast: Secondary | ICD-10-CM | POA: Diagnosis not present

## 2023-01-11 ENCOUNTER — Other Ambulatory Visit: Payer: Self-pay | Admitting: Internal Medicine

## 2023-01-14 ENCOUNTER — Ambulatory Visit: Payer: Medicare Other | Attending: Internal Medicine | Admitting: Internal Medicine

## 2023-01-14 ENCOUNTER — Other Ambulatory Visit
Admission: RE | Admit: 2023-01-14 | Discharge: 2023-01-14 | Disposition: A | Discharge status: Home / Self Care | Payer: Medicare Other | Source: Ambulatory Visit | Attending: Internal Medicine | Admitting: Internal Medicine

## 2023-01-14 ENCOUNTER — Encounter: Payer: Self-pay | Admitting: Internal Medicine

## 2023-01-14 VITALS — BP 130/80 | HR 68 | Ht 66.0 in | Wt 184.2 lb

## 2023-01-14 DIAGNOSIS — R0609 Other forms of dyspnea: Secondary | ICD-10-CM | POA: Insufficient documentation

## 2023-01-14 DIAGNOSIS — E782 Mixed hyperlipidemia: Secondary | ICD-10-CM

## 2023-01-14 DIAGNOSIS — R072 Precordial pain: Secondary | ICD-10-CM

## 2023-01-14 DIAGNOSIS — I10 Essential (primary) hypertension: Secondary | ICD-10-CM | POA: Diagnosis present

## 2023-01-14 LAB — BASIC METABOLIC PANEL
Anion gap: 8 (ref 5–15)
BUN: 27 mg/dL — ABNORMAL HIGH (ref 8–23)
CO2: 26 mmol/L (ref 22–32)
Calcium: 9.5 mg/dL (ref 8.9–10.3)
Chloride: 103 mmol/L (ref 98–111)
Creatinine, Ser: 0.92 mg/dL (ref 0.44–1.00)
GFR, Estimated: >60 mL/min (ref >60)
Glucose, Bld: 102 mg/dL — ABNORMAL HIGH (ref 70–99)
Potassium: 4.4 mmol/L (ref 3.5–5.1)
Sodium: 137 mmol/L (ref 135–145)

## 2023-01-14 MED ORDER — METOPROLOL TARTRATE 100 MG PO TABS: ORAL_TABLET | ORAL | 0 refills | Status: DC

## 2023-01-14 MED ORDER — ASPIRIN 81 MG PO TBEC: 81.0000 mg | DELAYED_RELEASE_TABLET | Freq: Every day | ORAL | 12 refills | Status: DC

## 2023-01-14 MED ORDER — AMLODIPINE BESYLATE 5 MG PO TABS: ORAL_TABLET | ORAL | 3 refills | Status: DC

## 2023-01-14 NOTE — Patient Instructions (Addendum)
Medication Instructions:  START TAKING: Aspirin 81 mg daily  *If you need a refill on your cardiac medications before your next appointment, please call your pharmacy*   Lab Work: Your provider would like for you to have following labs drawn: (BMP).   Please go to the [facility] entrance and check in at the front desk.  You do not need an appointment.  They are open from 7am-6 pm.   If you have labs (blood work) drawn today and your tests are completely normal, you will receive your results only by: MyChart Message (if you have MyChart) OR A paper copy in the mail If you have any lab test that is abnormal or we need to change your treatment, we will call you to review the results.   Testing/Procedures: Your physician has requested that you have an echocardiogram. Echocardiography is a painless test that uses sound waves to create images of your heart. It provides your doctor with information about the size and shape of your heart and how well your heart's chambers and valves are working.   You may receive an ultrasound enhancing agent through an IV if needed to better visualize your heart during the echo. This procedure takes approximately one hour.  There are no restrictions for this procedure.  This will take place at [address] ([facility]) [phone], [place] [address]   Cardiac CT Angiography (CTA), is a special type of CT scan that uses a computer to produce multi-dimensional views of major blood vessels throughout the body. In CT angiography, a contrast material is injected through an IV to help visualize the blood vessels  Please see instructions below  Follow-Up: At [facility], you and your health needs are our priority.  As part of our continuing mission to provide you with exceptional heart care, we have created designated Provider Care Teams.  These Care Teams include your primary Cardiologist (physician) and Advanced Practice Providers (APPs  -  Physician Assistants and Nurse Practitioners) who all work together to provide you with the care you need, when you need it.  We recommend signing up for the patient portal called "MyChart".  Sign up information is provided on this After Visit Summary.  MyChart is used to connect with patients for Virtual Visits (Telemedicine).  Patients are able to view lab/test results, encounter notes, upcoming appointments, etc.  Non-urgent messages can be sent to your provider as well.   To learn more about what you can do with MyChart, go to [email or web address].    Your next appointment:   6 month(s)  Provider:   You may see [name], MD or one of the following Advanced Practice Providers on your designated Care Team:   Nicolasa Ducking, NP Eula Listen, PA-C Cadence Fransico Michael, PA-C Charlsie Quest, NP      Your cardiac CT will be scheduled at one of the below locations:   [facility] [address] [place], [place] [address] [phone]  OR   [facility] [address] [place], [place] [address] [phone]  If scheduled at [facility], please arrive at the [facility] and Children's Entrance (Entrance C2) of [facility] 30 minutes prior to test start time. You can use the FREE valet parking offered at entrance C (encouraged to control the heart rate for the test)  Proceed to the [facility] Radiology Department (first floor) to check-in and test prep.  All radiology patients and guests  should use entrance C2 at [facility], accessed from [facility], even though the hospital's physical address listed is [address].    If scheduled at [facility] or [facility], please arrive 15 mins early for check-in and test prep.   Please follow these instructions carefully (unless otherwise directed):  On the Night Before the  Test: Be sure to Drink plenty of water. Do not consume any caffeinated/decaffeinated beverages or chocolate 12 hours prior to your test. Do not take any antihistamines 12 hours prior to your test.   On the Day of the Test: Drink plenty of water until 1 hour prior to the test. Do not eat any food 1 hour prior to test. You may take your regular medications prior to the test.  Take metoprolol (Lopressor) 100 mg two hours prior to test. If you take Furosemide/Hydrochlorothiazide/Spironolactone, please HOLD on the morning of the test. FEMALES- please wear underwire-free bra if available, avoid dresses & tight clothing       After the Test: Drink plenty of water. After receiving IV contrast, you may experience a mild flushed feeling. This is normal. On occasion, you may experience a mild rash up to 24 hours after the test. This is not dangerous. If this occurs, you can take Benadryl 25 mg and increase your fluid intake. If you experience trouble breathing, this can be serious. If it is severe call 911 IMMEDIATELY. If it is mild, please call our office. If you take any of these medications: Glipizide/Metformin, Avandament, Glucavance, please do not take 48 hours after completing test unless otherwise instructed.  We will call to schedule your test 2-4 weeks out understanding that some insurance companies will need an authorization prior to the service being performed.   For non-scheduling related questions, please contact the cardiac imaging nurse navigator should you have any questions/concerns: Rockwell Alexandria, Cardiac Imaging Nurse Navigator Larey Brick, Cardiac Imaging Nurse Navigator [facility] Direct Office Dial: [phone]   For scheduling needs, including cancellations and rescheduling, please call [place], [phone].

## 2023-01-14 NOTE — Progress Notes (Signed)
Follow-up Outpatient Visit Date: 01/14/2023  Primary Care Provider: Patrice Paradise, MD [address] [facility] [place] [address]  Chief Complaint: Shortness of breath  HPI:  Tracey Mosley is a 72 y.o. female with history of hypertension, hyperlipidemia, hypothyroidism, and obesity, who presents for follow-up of dyspnea on exertion and hypertension.  I last saw her in January, which time she was feeling a little bit better with less shortness of breath.  We discussed repeating an echocardiogram but agreed to defer this in favor of Tracey Mosley trying to increase her activity and exercise tolerance.  Today, Tracey Mosley reports that she has had cold symptoms since late last week.  She notes that COVID-19 test was negative.  Even before developing URI symptoms, she still noticed exertional dyspnea.  She also reports occasional chest tightness; it last occurred in bed yesterday when rolling over.  She denies palpitations and lightheadedness.  She endorses occasional mild pretibial edema.  --------------------------------------------------------------------------------------------------  Cardiovascular History & Procedures: Cardiovascular Problems: Hypertension Orthostatic lightheadedness Dyspnea on exertion   Risk Factors: Hypertension, hyperlipidemia, and obesity   Cath/PCI: None.   CV Surgery: None.   EP Procedures and Devices: None.   Non-Invasive Evaluation(s): Pharmacologic MPI (06/08/17): Low risk study without ischemia or scar. Normal LVEF. Renal artery Doppler (05/17/2017): No evidence of significant renal artery stenosis. TTE (05/18/17): Normal LV size and function. LVEF 55-60%. Normal RV size and function. No significant valvular abnormalities.  Recent CV Pertinent Labs: Lab Results  Component Value Date   K 3.4 (L) 04/30/2021   K 4.5 03/17/2014   BUN 25 (H) 04/30/2021   BUN 22 (H) 03/17/2014   CREATININE 1.01 (H) 04/30/2021   CREATININE 0.84  03/17/2014    Past medical and surgical history were reviewed and updated in EPIC.  Current Meds  Medication Sig   albuterol (VENTOLIN HFA) 108 (90 Base) MCG/ACT inhaler Inhale 1-2 puffs into the lungs every 4 (four) hours as needed.   amLODipine (NORVASC) 5 MG tablet TAKE 1 TABLET(5 MG) BY MOUTH DAILY   Cyanocobalamin 1000 MCG/ML KIT Inject as directed every 30 (thirty) days.   escitalopram (LEXAPRO) 20 MG tablet TAKE 1 TABLET BY MOUTH ONCE DAILY   fluticasone (FLONASE) 50 MCG/ACT nasal spray Place 1 spray into both nostrils daily.    glycopyrrolate (ROBINUL) 1 MG tablet Take 1 mg by mouth 2 (two) times daily.   hydrochlorothiazide (HYDRODIURIL) 25 MG tablet TAKE 1 TABLET BY MOUTH  DAILY   levothyroxine (SYNTHROID, LEVOTHROID) 50 MCG tablet Take 50 mcg by mouth daily before breakfast.    meloxicam (MOBIC) 7.5 MG tablet Take 7.5 mg by mouth daily as needed for pain.   Multiple Vitamin (MULTI-VITAMINS) TABS Take 1 tablet by mouth daily.   simvastatin (ZOCOR) 20 MG tablet TAKE 1 TABLET(20 MG) BY MOUTH AT BEDTIME   valsartan (DIOVAN) 320 MG tablet TAKE 1 TABLET(320 MG) BY MOUTH DAILY    Allergies: Codeine, Codeine sulfate, Epinephrine, and Sulfa antibiotics  Social History   Tobacco Use   Smoking status: Never   Smokeless tobacco: Never  Vaping Use   Vaping Use: Never used  Substance Use Topics   Alcohol use: Yes    Alcohol/week: 2.0 standard drinks of alcohol    Types: 2 Cans of beer per week    Comment: once every 3-4 months   Drug use: No    Family History  Problem Relation Age of Onset   Cancer Brother  Abdomina   Heart disease Mother        leaky valves   Heart attack Father 13   Aneurysm Father    Breast cancer Neg Hx     Review of Systems: A 12-system review of systems was performed and was negative except as noted in the HPI.  --------------------------------------------------------------------------------------------------  Physical Exam: BP 130/80  (BP Location: Left Arm, Patient Position: Sitting, Cuff Size: Large)   Pulse 68   Ht  (1.676 m)   Wt 184 lb 4 oz (83.6 kg)   SpO2 98%   BMI 29.74 kg/m   General:  NAD.  Nasal/sinus congestion noted. Neck: No JVD or HJR. Lungs: Clear to auscultation bilaterally without wheezes or crackles. Heart: Regular rate and rhythm without murmurs, rubs, or gallops. Abdomen: Soft, nontender, nondistended. Extremities: No lower extremity edema.  EKG:  Normal sinus rhythm with low voltage, poor R-wave progression, and possible inferior infarct.  No significant change from prior tracing on 10/15/2022.  Lab Results  Component Value Date   WBC 9.1 03/17/2014   HGB 14.1 03/17/2014   HCT 42.0 03/17/2014   MCV 94 03/17/2014   PLT 206 03/17/2014    Lab Results  Component Value Date   NA 139 04/30/2021   K 3.4 (L) 04/30/2021   CL 103 04/30/2021   CO2 25 04/30/2021   BUN 25 (H) 04/30/2021   CREATININE 1.01 (H) 04/30/2021   GLUCOSE 102 (H) 04/30/2021   ALT 25 05/06/2017    No results found for: "CHOL", "HDL", "LDLCALC", "LDLDIRECT", "TRIG", "CHOLHDL"  --------------------------------------------------------------------------------------------------  ASSESSMENT AND PLAN: Dyspnea on exertion and chest pain: Current symptoms confounded by URI.  However, dyspnea has not entirely resolved following bout of "bronchitis" last year.  Some atypical chest pain also reported today.   EKG again shows low voltage and possible anterior and inferior infarcts.  We have agreed to obtain an echocardiogram and coronary CTA for further evaluation.  If CTA is negative and CTA does not show evidence of obstructive CAD, we will need to consider further workup for infiltrative cardiomyopathy (particularly if there is echo evidence of LVH with low voltage noted on EKG).  We will have Tracey Mosley begin taking aspirin 81 mg daily pending coronary CTA.  Hypertension: BP upper normal today.  Continue current medications  as BP may elevated by current use of OTC cold medications.  Hyperlipidemia: Ongoing management per Dr. Merlinda Frederick.  If there is evidence of significant ASCVD on coronary CTA, escalation of statin therapy will need to be considered.  Follow-up: Return to clinic in 6 months (sooner if significant abnormalities noted on aforementioned testing).  [name], MD 01/14/2023 10:27 AM

## 2023-01-15 ENCOUNTER — Ambulatory Visit: Payer: Medicare Other | Attending: Internal Medicine

## 2023-01-15 DIAGNOSIS — R072 Precordial pain: Secondary | ICD-10-CM | POA: Diagnosis not present

## 2023-01-15 DIAGNOSIS — R0609 Other forms of dyspnea: Secondary | ICD-10-CM | POA: Diagnosis not present

## 2023-01-16 LAB — ECHOCARDIOGRAM COMPLETE
AR max vel: 2.46 cm2
AV Area VTI: 2.41 cm2
AV Area mean vel: 2.44 cm2
AV Mean grad: 4 mmHg
AV Peak grad: 8 mmHg
Ao pk vel: 1.42 m/s
Area-P 1/2: 2.97 cm2
Calc EF: 54.4 %
Single Plane A2C EF: 53.7 %
Single Plane A4C EF: 54 %

## 2023-01-17 ENCOUNTER — Encounter: Payer: Self-pay | Admitting: Internal Medicine

## 2023-01-17 DIAGNOSIS — R072 Precordial pain: Secondary | ICD-10-CM | POA: Insufficient documentation

## 2023-02-08 ENCOUNTER — Telehealth: Payer: Self-pay | Admitting: Internal Medicine

## 2023-02-08 MED ORDER — SIMVASTATIN 20 MG PO TABS: 20.0000 mg | ORAL_TABLET | Freq: Every day | ORAL | 0 refills | Status: DC

## 2023-02-08 NOTE — Telephone Encounter (Signed)
*  STAT* If patient is at the pharmacy, call can be transferred to refill team.   1. Which medications need to be refilled? (please list name of each medication and dose if known)   simvastatin (ZOCOR) 20 MG tablet   2. Which pharmacy/location (including street and city if local pharmacy) is medication to be sent to?    [id number] - [place], [place] - [address] AT [facility] [address]   3. Do they need a 30 day or 90 day supply?   90 day supply  Patient stated she has 4 tablets left.

## 2023-02-08 NOTE — Telephone Encounter (Signed)
Requested Prescriptions   Signed Prescriptions Disp Refills   simvastatin (ZOCOR) 20 MG tablet 90 tablet 0    Sig: Take 1 tablet (20 mg total) by mouth daily.    Authorizing Provider: END, CHRISTOPHER    Ordering User: Thayer Headings, Lindon Kiel L

## 2023-02-10 ENCOUNTER — Other Ambulatory Visit: Payer: Self-pay | Admitting: Internal Medicine

## 2023-02-10 ENCOUNTER — Telehealth (HOSPITAL_COMMUNITY): Payer: Self-pay | Admitting: Emergency Medicine

## 2023-02-10 NOTE — Telephone Encounter (Signed)
Reaching out to patient to offer assistance regarding upcoming cardiac imaging study; pt verbalizes understanding of appt date/time, parking situation and where to check in, pre-test NPO status and medications ordered, and verified current allergies; name and call back number provided for further questions should they arise Mailin Coglianese RN Navigator Cardiac Imaging [facility] [phone] office [phone] cell 

## 2023-02-11 ENCOUNTER — Ambulatory Visit
Admission: RE | Admit: 2023-02-11 | Discharge: 2023-02-11 | Disposition: A | Discharge status: Home / Self Care | Payer: Medicare Other | Source: Ambulatory Visit | Attending: Internal Medicine | Admitting: Internal Medicine

## 2023-02-11 DIAGNOSIS — R072 Precordial pain: Secondary | ICD-10-CM | POA: Insufficient documentation

## 2023-02-11 MED ORDER — NITROGLYCERIN 0.4 MG SL SUBL
0.8000 mg | SUBLINGUAL_TABLET | Freq: Once | SUBLINGUAL | Status: AC
  Administered 2023-02-11: 0.8 mg via SUBLINGUAL

## 2023-02-11 MED ORDER — IOHEXOL 350 MG/ML SOLN
75.0000 mL | Freq: Once | INTRAVENOUS | Status: AC | PRN
  Administered 2023-02-11: 75 mL via INTRAVENOUS

## 2023-02-11 NOTE — Progress Notes (Signed)
Patient tolerated CT well. Drank water after. Vital signs stable encourage to drink water throughout day.Reasons explained and verbalized understanding. Ambulated steady gait.  

## 2023-04-09 ENCOUNTER — Other Ambulatory Visit: Payer: Self-pay | Admitting: Internal Medicine

## 2023-05-03 ENCOUNTER — Other Ambulatory Visit: Payer: Self-pay | Admitting: Internal Medicine

## 2023-05-03 MED ORDER — VALSARTAN 320 MG PO TABS: 320.0000 mg | ORAL_TABLET | Freq: Every day | ORAL | 0 refills | Status: DC

## 2023-06-04 ENCOUNTER — Telehealth: Payer: Self-pay | Admitting: Internal Medicine

## 2023-06-04 MED ORDER — SIMVASTATIN 20 MG PO TABS: 20.0000 mg | ORAL_TABLET | Freq: Every day | ORAL | 0 refills | Status: DC

## 2023-06-04 NOTE — Telephone Encounter (Signed)
*  STAT* If patient is at the pharmacy, call can be transferred to refill team.   1. Which medications need to be refilled? (please list name of each medication and dose if known)   simvastatin (ZOCOR) 20 MG tablet    2. Which pharmacy/location (including street and city if local pharmacy) is medication to be sent to?  [id number] - [place], [place] - [address] AT [facility] [address]      3. Do they need a 30 day or 90 day supply? 90 day    Pt is completely out of medication.

## 2023-06-04 NOTE — Telephone Encounter (Signed)
Requested Prescriptions   Signed Prescriptions Disp Refills   simvastatin (ZOCOR) 20 MG tablet 90 tablet 0    Sig: Take 1 tablet (20 mg total) by mouth daily.    Authorizing Provider: END, CHRISTOPHER    Ordering User: Kendrick Fries

## 2023-06-07 ENCOUNTER — Other Ambulatory Visit: Payer: Self-pay

## 2023-06-07 DIAGNOSIS — N200 Calculus of kidney: Secondary | ICD-10-CM

## 2023-06-08 ENCOUNTER — Ambulatory Visit
Admission: RE | Admit: 2023-06-08 | Discharge: 2023-06-08 | Disposition: A | Discharge status: Home / Self Care | Payer: Medicare Other | Source: Ambulatory Visit | Attending: Physician Assistant | Admitting: *Deleted

## 2023-06-08 ENCOUNTER — Ambulatory Visit
Admission: RE | Admit: 2023-06-08 | Discharge: 2023-06-08 | Disposition: A | Discharge status: Home / Self Care | Payer: Medicare Other | Attending: Physician Assistant | Admitting: Physician Assistant

## 2023-06-08 ENCOUNTER — Ambulatory Visit
Admission: RE | Admit: 2023-06-08 | Discharge: 2023-06-08 | Disposition: A | Discharge status: Home / Self Care | Payer: Medicare Other | Source: Ambulatory Visit | Attending: Physician Assistant | Admitting: Physician Assistant

## 2023-06-08 ENCOUNTER — Ambulatory Visit (INDEPENDENT_AMBULATORY_CARE_PROVIDER_SITE_OTHER): Payer: Medicare Other | Admitting: Physician Assistant

## 2023-06-08 VITALS — BP 126/79 | HR 80 | Ht 66.0 in | Wt 190.0 lb

## 2023-06-08 DIAGNOSIS — R109 Unspecified abdominal pain: Secondary | ICD-10-CM | POA: Insufficient documentation

## 2023-06-08 DIAGNOSIS — Z87442 Personal history of urinary calculi: Secondary | ICD-10-CM

## 2023-06-08 DIAGNOSIS — N1330 Unspecified hydronephrosis: Secondary | ICD-10-CM | POA: Diagnosis not present

## 2023-06-08 DIAGNOSIS — N200 Calculus of kidney: Secondary | ICD-10-CM | POA: Insufficient documentation

## 2023-06-08 LAB — URINALYSIS, COMPLETE
Bilirubin, UA: NEGATIVE
Glucose, UA: NEGATIVE
Ketones, UA: NEGATIVE
Nitrite, UA: NEGATIVE
Protein,UA: NEGATIVE
Specific Gravity, UA: 1.025 (ref 1.005–1.030)
Urobilinogen, Ur: 0.2 mg/dL (ref 0.2–1.0)
pH, UA: 5.5 (ref 5.0–7.5)

## 2023-06-08 LAB — MICROSCOPIC EXAMINATION

## 2023-06-08 NOTE — Progress Notes (Unsigned)
06/08/2023 9:26 AM   Tracey Mosley 09/14/1951 [id number]  CC: Chief Complaint  Patient presents with   Follow-up   HPI: Tracey Mosley is a 72 y.o. female with PMH nephrolithiasis and hematuria with benign CTU in 2020 and cystoscopy in 2022 who presents today for evaluation of a possible acute stone episode.   Today she reports several days of discomfort in her left flank and left abdomen and increased nocturia to every 2 hours.  She has been nauseated this morning, but admits this may be due to anxiety over possible stone episode.  Notably, she has a history of scoliosis and states that her back has been acting up recently.  She is planning to travel to the beach later this week.  KUB today with a stable appearing 8 mm left lower pole stone.  In-office UA today positive for 1+ blood and trace leukocytes; urine microscopy with 3-10 RBCs/HPF.  PMH: Past Medical History:  Diagnosis Date   Abnormal ECG    a. inferior Q's, poor R progression - dates back to 06/2014; b. 05/2017 MV: No ischemia/infarct.   History of echocardiogram    a. 04/2017 Echo: EF 55-60%, nl RV size/fxn.   Hyperlipidemia    Hypertension    Kidney stones    Osteoporosis    Thyroid disease     Surgical History: Past Surgical History:  Procedure Laterality Date   CHOLECYSTECTOMY     EXTRACORPOREAL SHOCK WAVE LITHOTRIPSY Left 09/09/2017   Procedure: EXTRACORPOREAL SHOCK WAVE LITHOTRIPSY (ESWL);  Surgeon: Riki Altes, MD;  Location: [facility] ORS;  Service: Urology;  Laterality: Left;   EXTRACORPOREAL SHOCK WAVE LITHOTRIPSY Left 09/16/2017   Procedure: EXTRACORPOREAL SHOCK WAVE LITHOTRIPSY (ESWL);  Surgeon: Vanna Scotland, MD;  Location: [facility] ORS;  Service: Urology;  Laterality: Left;   LITHOTRIPSY      Home Medications:  Allergies as of 06/08/2023       Reactions   Codeine Nausea Only   Codeine Sulfate Nausea Only   Epinephrine Other (See Comments)   Tremors   Sulfa Antibiotics Nausea And Vomiting         Medication List        Accurate as of June 08, 2023  9:26 AM. If you have any questions, ask your nurse or doctor.          albuterol 108 (90 Base) MCG/ACT inhaler Commonly known as: VENTOLIN HFA Inhale 1-2 puffs into the lungs every 4 (four) hours as needed.   amLODipine 5 MG tablet Commonly known as: NORVASC TAKE 1 TABLET(5 MG) BY MOUTH DAILY   aspirin EC 81 MG tablet Take 1 tablet (81 mg total) by mouth daily. Swallow whole.   Cyanocobalamin 1000 MCG/ML Kit Inject as directed every 30 (thirty) days.   escitalopram 20 MG tablet Commonly known as: LEXAPRO TAKE 1 TABLET BY MOUTH ONCE DAILY   fluticasone 50 MCG/ACT nasal spray Commonly known as: FLONASE Place 1 spray into both nostrils daily.   glycopyrrolate 1 MG tablet Commonly known as: ROBINUL Take 1 mg by mouth 2 (two) times daily.   hydrochlorothiazide 25 MG tablet Commonly known as: HYDRODIURIL TAKE 1 TABLET BY MOUTH  DAILY   levothyroxine 50 MCG tablet Commonly known as: SYNTHROID Take 50 mcg by mouth daily before breakfast.   meloxicam 7.5 MG tablet Commonly known as: MOBIC Take 7.5 mg by mouth daily as needed for pain.   metoprolol tartrate 100 MG tablet Commonly known as: LOPRESSOR TAKE 1 TABLET 2 HR PRIOR TO  CARDIAC PROCEDURE   Multi-Vitamins Tabs Take 1 tablet by mouth daily.   simvastatin 20 MG tablet Commonly known as: ZOCOR Take 1 tablet (20 mg total) by mouth daily.   valsartan 320 MG tablet Commonly known as: DIOVAN Take 1 tablet (320 mg total) by mouth daily.        Allergies:  Allergies  Allergen Reactions   Codeine Nausea Only   Codeine Sulfate Nausea Only   Epinephrine Other (See Comments)    Tremors   Sulfa Antibiotics Nausea And Vomiting    Family History: Family History  Problem Relation Age of Onset   Cancer Brother        Abdomina   Heart disease Mother        leaky valves   Heart attack Father 54   Aneurysm Father    Breast cancer Neg Hx      Social History:   reports that she has never smoked. She has never used smokeless tobacco. She reports current alcohol use of about 2.0 standard drinks of alcohol per week. She reports that she does not use drugs.  Physical Exam: BP 126/79   Pulse 80   Ht 5\' 6"  (1.676 m)   Wt 190 lb (86.2 kg)   BMI 30.67 kg/m   Constitutional:  Alert and oriented, no acute distress, nontoxic appearing HEENT: [place], AT Cardiovascular: No clubbing, cyanosis, or edema Respiratory: Normal respiratory effort, no increased work of breathing Skin: No rashes, bruises or suspicious lesions Neurologic: Grossly intact, no focal deficits, moving all 4 extremities Psychiatric: Normal mood and affect  Laboratory Data: Results for orders placed or performed in visit on 06/08/23  Microscopic Examination   Urine  Result Value Ref Range   WBC, UA 0-5 0 - 5 /hpf   RBC, Urine 3-10 (A) 0 - 2 /hpf   Epithelial Cells (non renal) 0-10 0 - 10 /hpf   Mucus, UA Present (A) Not Estab.   Bacteria, UA Few None seen/Few  Urinalysis, Complete  Result Value Ref Range   Specific Gravity, UA 1.025 1.005 - 1.030   pH, UA 5.5 5.0 - 7.5   Color, UA Yellow Yellow   Appearance Ur Hazy (A) Clear   Leukocytes,UA Trace (A) Negative   Protein,UA Negative Negative/Trace   Glucose, UA Negative Negative   Ketones, UA Negative Negative   RBC, UA 1+ (A) Negative   Bilirubin, UA Negative Negative   Urobilinogen, Ur 0.2 0.2 - 1.0 mg/dL   Nitrite, UA Negative Negative   Microscopic Examination See below:    Pertinent Imaging: KUB, 06/08/2023: CLINICAL DATA:  Left flank pain. History of kidney stones and lithotripsy.   EXAM: ABDOMEN - 1 VIEW   COMPARISON:  Radiographs 01/29/2021.  CT 02/21/2019.   FINDINGS: Small calculi project over the lower pole of the left kidney, similar to previous studies. No new calcifications are seen along the expected course of the ureters. The visualized bowel gas pattern is normal. There are  degenerative changes in the spine associated with a convex left scoliosis. Cholecystectomy clips are noted.   IMPRESSION: Similar appearance of left-sided nephrolithiasis. No acute findings.     Electronically Signed   By: Carey Bullocks M.D.   On: 06/08/2023 17:33  I personally reviewed the images referenced above and note a stable left lower pole stone.  Assessment & Plan:   1. Flank pain with history of urolithiasis Stone appears stable on KUB today and she has a history of hematuria.  UA is otherwise bland.  Overall low suspicion for acute stone episode, however I did offer her additional imaging for confirmation.  We discussed CT stone study versus renal ultrasound and she elected to proceed with renal ultrasound.  I sent her from clinic for stat ultrasound today and will call her with results when available.  She is in agreement with this plan. - Urinalysis, Complete - CULTURE, URINE COMPREHENSIVE - US RENAL; Future   Return for Will call with results.  Carman Ching, PA-C  [facility] [address] [place], [place] [address] [phone]

## 2023-06-09 NOTE — Progress Notes (Signed)
I spoke to the patient via telephone to discuss her renal ultrasound results. We discussed that her left lower pole stone does appear stable and nonobstructing, however she has new mild left hydronephrosis of undetermined etiology. I would like to proceed with CTU for further evaluation given her history of stones and hematuria. She expressed understanding and is in agreement with this plan. Will call with results when available. I ordered scan STAT; patient is travelling tomorrow afternoon through the weekend so will attempt to get it done prior to her departure since she's symptomatic.

## 2023-06-09 NOTE — Addendum Note (Signed)
Addended by: Debarah Crape on: 06/09/2023 09:13 AM   Modules accepted: Orders

## 2023-06-10 ENCOUNTER — Ambulatory Visit
Admission: RE | Admit: 2023-06-10 | Discharge: 2023-06-10 | Disposition: A | Discharge status: Home / Self Care | Payer: Medicare Other | Source: Ambulatory Visit | Attending: Physician Assistant | Admitting: Physician Assistant

## 2023-06-10 DIAGNOSIS — R109 Unspecified abdominal pain: Secondary | ICD-10-CM | POA: Diagnosis present

## 2023-06-10 DIAGNOSIS — Z87442 Personal history of urinary calculi: Secondary | ICD-10-CM | POA: Diagnosis present

## 2023-06-10 DIAGNOSIS — N1330 Unspecified hydronephrosis: Secondary | ICD-10-CM | POA: Diagnosis present

## 2023-06-10 MED ORDER — IOHEXOL 300 MG/ML  SOLN
100.0000 mL | Freq: Once | INTRAMUSCULAR | Status: AC | PRN
  Administered 2023-06-10: 100 mL via INTRAVENOUS

## 2023-06-11 LAB — CULTURE, URINE COMPREHENSIVE

## 2023-07-27 ENCOUNTER — Other Ambulatory Visit: Payer: Self-pay | Admitting: Internal Medicine

## 2023-07-28 MED ORDER — VALSARTAN 320 MG PO TABS: 320.0000 mg | ORAL_TABLET | Freq: Every day | ORAL | 0 refills | Status: DC

## 2023-08-23 ENCOUNTER — Encounter: Payer: Self-pay | Admitting: Gastroenterology

## 2023-08-29 ENCOUNTER — Other Ambulatory Visit: Payer: Self-pay | Admitting: Internal Medicine

## 2023-08-31 ENCOUNTER — Ambulatory Visit: Payer: Medicare Other | Admitting: Urology

## 2023-09-01 ENCOUNTER — Other Ambulatory Visit: Payer: Self-pay | Admitting: Internal Medicine

## 2023-09-08 ENCOUNTER — Ambulatory Visit: Payer: Medicare Other | Attending: Internal Medicine | Admitting: Internal Medicine

## 2023-09-08 ENCOUNTER — Encounter: Payer: Self-pay | Admitting: Internal Medicine

## 2023-09-08 VITALS — BP 124/82 | HR 66 | Ht 66.0 in | Wt 188.4 lb

## 2023-09-08 DIAGNOSIS — Z79899 Other long term (current) drug therapy: Secondary | ICD-10-CM | POA: Diagnosis present

## 2023-09-08 DIAGNOSIS — E782 Mixed hyperlipidemia: Secondary | ICD-10-CM

## 2023-09-08 DIAGNOSIS — Z01810 Encounter for preprocedural cardiovascular examination: Secondary | ICD-10-CM

## 2023-09-08 DIAGNOSIS — R0609 Other forms of dyspnea: Secondary | ICD-10-CM | POA: Diagnosis present

## 2023-09-08 DIAGNOSIS — I10 Essential (primary) hypertension: Secondary | ICD-10-CM | POA: Diagnosis present

## 2023-09-08 NOTE — Progress Notes (Signed)
Cardiology Office Note:  .   Date:  09/08/2023  ID:  Tracey Mosley, DOB [birth year], MRN [id number] PCP: Patrice Paradise, MD  [facility] Providers Cardiologist:  [name], MD     History of Present Illness: .   Tracey Mosley is a 72 y.o. female with history of hypertension, hyperlipidemia, hypothyroidism, and obesity, who present for follow-up of dyspnea on exertion.  I last saw her in 12/2022, at which time she was recovering from a URI.  Before these symptoms started, she continued to experience DOE as well as occasional chest tightness.  We agreed to perform an echo and coronary CTA.  Echo was unremarkable.  Coronary CTA showed mild plaquing in the LAD but no significant disease.  Incidentally noted pulmonary nodule is being followed by serial CT scans.  Today, Tracey Mosley reports that she has been feeling fairly well.  She denies chest pain, shortness of breath, palpitations, lightheadedness, and edema.  Home blood pressures have been in the normal range.  She is trying to do some chair exercises and notes that she goes up and down the stairs looking after her grandson quite a bit.  She is scheduled for colonoscopy shortly after Christmas.  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  CAC score 148 (77th percentile).  No significant extracardiac findings.  TTE (01/15/2023): Normal LV size and wall thickness.  LVEF 60-65% with grade 1 diastolic dysfunction.  Normal RV  size and function.  No pericardial effusion.  No significant valvular abnormalities.  Risk Assessment/Calculations:             Physical Exam:   VS:  BP 124/82 (BP Location: Left Arm, Patient Position: Sitting, Cuff Size: Normal)   Pulse 66   Ht 5\' 6"  (1.676 m)   Wt 188 lb 6.4 oz (85.5 kg)   SpO2 97%   BMI 30.41 kg/m    Wt Readings from Last 3 Encounters:  09/08/23 188 lb 6.4 oz (85.5 kg)  06/08/23 190 lb (86.2 kg)  01/14/23 184 lb 4 oz (83.6 kg)    General:  NAD. Neck: No JVD or HJR. Lungs: Clear to auscultation bilaterally without wheezes or crackles. Heart: Regular rate and rhythm without murmurs, rubs, or gallops. Abdomen: Soft, nontender, nondistended. Extremities: No lower extremity edema.  ASSESSMENT AND PLAN: .    Dyspnea on exertion: Ms. Corner denies significant dyspnea.  Chest pain reported at our last visit has also been quiescent.  Echo and coronary CTA earlier this year were reassuring.  No further workup recommended at this time.  I think it is reasonable for Tracey Mosley to stop taking daily aspirin.  Hypertension: Diastolic blood pressure borderline today.  Overall, blood pressure seems to be fairly well-controlled at home.  Defer medication changes at this time.  Hyperlipidemia: LDL suboptimal on last check through Dr. Carmon Ginsberg office.  We discussed escalation of simvastatin versus switching to another statin to help bring her LDL to less than 100.  However, Ms.  Mosley wishes to continue her current dose of simvastatin and focus on lifestyle modifications first.  We will plan to recheck a fasting lipid panel in 3 months.  If LDL remains greater than 100, escalation of statin therapy will need to be reconsidered.  Preoperative cardiovascular risk assessment: Tracey Mosley is scheduled for colonoscopy later this month.  She does not have any unstable cardiac symptoms.  She can proceed with this low risk procedure without additional cardiac testing or  intervention.    Dispo: Return to clinic in 6 months.  Signed, [name], MD

## 2023-09-08 NOTE — Patient Instructions (Signed)
Medication Instructions:  Your physician recommends the following medication changes.  STOP TAKING: Aspirin 81 mg  *If you need a refill on your cardiac medications before your next appointment, please call your pharmacy*   Lab Work: Your provider would like for you to return in March, 2025 to have the following labs drawn: (Lipid).   Please go to [facility] [address] ([facility] [address], [place] [address] You do not need an appointment.  They are open from 7:30 am-4 pm.  Lunch from 1:00 pm- 2:00 pm You will need to be fasting.  Testing/Procedures: No test ordered today    Follow-Up: At [facility], you and your health needs are our priority.  As part of our continuing mission to provide you with exceptional heart care, we have created designated Provider Care Teams.  These Care Teams include your primary Cardiologist (physician) and Advanced Practice Providers (APPs -  Physician Assistants and Nurse Practitioners) who all work together to provide you with the care you need, when you need it.  We recommend signing up for the patient portal called "MyChart".  Sign up information is provided on this After Visit Summary.  MyChart is used to connect with patients for Virtual Visits (Telemedicine).  Patients are able to view lab/test results, encounter notes, upcoming appointments, etc.  Non-urgent messages can be sent to your provider as well.   To learn more about what you can do with MyChart, go to [email or web address].    Your next appointment:   6 month(s)  Provider:   You may see [name], MD or one of the following Advanced Practice Providers on your designated Care Team:   Nicolasa Ducking, NP Eula Listen, PA-C Cadence Fransico Michael, PA-C Charlsie Quest, NP Carlos Levering, NP

## 2023-09-15 ENCOUNTER — Encounter: Payer: Self-pay | Admitting: Gastroenterology

## 2023-09-20 ENCOUNTER — Encounter: Payer: Self-pay | Admitting: Gastroenterology

## 2023-09-24 ENCOUNTER — Encounter: Payer: Self-pay | Admitting: Gastroenterology

## 2023-09-24 ENCOUNTER — Encounter
Admission: RE | Disposition: A | Discharge status: Home / Self Care | Payer: Self-pay | Source: Home / Self Care | Attending: Gastroenterology

## 2023-09-24 ENCOUNTER — Ambulatory Visit: Payer: Medicare Other | Admitting: Anesthesiology

## 2023-09-24 ENCOUNTER — Ambulatory Visit
Admission: RE | Admit: 2023-09-24 | Discharge: 2023-09-24 | Disposition: A | Discharge status: Home / Self Care | Payer: Medicare Other | Attending: Gastroenterology | Admitting: Gastroenterology

## 2023-09-24 ENCOUNTER — Other Ambulatory Visit: Payer: Self-pay

## 2023-09-24 DIAGNOSIS — Z9049 Acquired absence of other specified parts of digestive tract: Secondary | ICD-10-CM | POA: Insufficient documentation

## 2023-09-24 DIAGNOSIS — F32A Depression, unspecified: Secondary | ICD-10-CM | POA: Diagnosis not present

## 2023-09-24 DIAGNOSIS — K5730 Diverticulosis of large intestine without perforation or abscess without bleeding: Secondary | ICD-10-CM | POA: Diagnosis not present

## 2023-09-24 DIAGNOSIS — K635 Polyp of colon: Secondary | ICD-10-CM | POA: Diagnosis not present

## 2023-09-24 DIAGNOSIS — D124 Benign neoplasm of descending colon: Secondary | ICD-10-CM | POA: Diagnosis not present

## 2023-09-24 DIAGNOSIS — K640 First degree hemorrhoids: Secondary | ICD-10-CM | POA: Diagnosis not present

## 2023-09-24 DIAGNOSIS — Z1211 Encounter for screening for malignant neoplasm of colon: Secondary | ICD-10-CM | POA: Diagnosis present

## 2023-09-24 DIAGNOSIS — I10 Essential (primary) hypertension: Secondary | ICD-10-CM | POA: Diagnosis not present

## 2023-09-24 DIAGNOSIS — Z79899 Other long term (current) drug therapy: Secondary | ICD-10-CM | POA: Diagnosis not present

## 2023-09-24 HISTORY — DX: Personal history of urinary calculi: Z87.442

## 2023-09-24 HISTORY — PX: POLYPECTOMY: SHX5525

## 2023-09-24 HISTORY — DX: Other microscopic hematuria: R31.29

## 2023-09-24 HISTORY — PX: COLONOSCOPY WITH PROPOFOL: SHX5780

## 2023-09-24 HISTORY — DX: Hypothyroidism, unspecified: E03.9

## 2023-09-24 SURGERY — COLONOSCOPY WITH PROPOFOL
Anesthesia: General

## 2023-09-24 MED ORDER — SODIUM CHLORIDE 0.9 % IV SOLN: INTRAVENOUS | Status: DC

## 2023-09-24 MED ORDER — LIDOCAINE HCL (CARDIAC) PF 100 MG/5ML IV SOSY
PREFILLED_SYRINGE | INTRAVENOUS | Status: DC | PRN
  Administered 2023-09-24: 60 mg via INTRAVENOUS

## 2023-09-24 MED ORDER — PHENYLEPHRINE HCL (PRESSORS) 10 MG/ML IV SOLN
INTRAVENOUS | Status: DC | PRN
  Administered 2023-09-24: 80 ug via INTRAVENOUS

## 2023-09-24 MED ORDER — GLYCOPYRROLATE 0.2 MG/ML IJ SOLN
INTRAMUSCULAR | Status: AC
Start: 2023-09-24 — End: ?
  Filled 2023-09-24: qty 1

## 2023-09-24 MED ORDER — GLYCOPYRROLATE 0.2 MG/ML IJ SOLN
INTRAMUSCULAR | Status: DC | PRN
  Administered 2023-09-24: .2 mg via INTRAVENOUS

## 2023-09-24 MED ORDER — PROPOFOL 500 MG/50ML IV EMUL
INTRAVENOUS | Status: DC | PRN
  Administered 2023-09-24: 125 ug/kg/min via INTRAVENOUS

## 2023-09-24 MED ORDER — PHENYLEPHRINE 80 MCG/ML (10ML) SYRINGE FOR IV PUSH (FOR BLOOD PRESSURE SUPPORT)
PREFILLED_SYRINGE | INTRAVENOUS | Status: AC
  Filled 2023-09-24: qty 10

## 2023-09-24 MED ORDER — EPHEDRINE 5 MG/ML INJ
INTRAVENOUS | Status: AC
  Filled 2023-09-24: qty 5

## 2023-09-24 MED ORDER — EPHEDRINE SULFATE-NACL 50-0.9 MG/10ML-% IV SOSY
PREFILLED_SYRINGE | INTRAVENOUS | Status: DC | PRN
  Administered 2023-09-24: 10 mg via INTRAVENOUS
  Administered 2023-09-24: 15 mg via INTRAVENOUS

## 2023-09-24 MED ORDER — PROPOFOL 10 MG/ML IV BOLUS
INTRAVENOUS | Status: DC | PRN
  Administered 2023-09-24: 50 mg via INTRAVENOUS

## 2023-09-24 MED ORDER — LIDOCAINE HCL (PF) 2 % IJ SOLN
INTRAMUSCULAR | Status: AC
  Filled 2023-09-24: qty 5

## 2023-09-24 NOTE — Anesthesia Postprocedure Evaluation (Signed)
Anesthesia Post Note  Patient: Tracey Mosley  Procedure(s) Performed: COLONOSCOPY WITH PROPOFOL  Patient location during evaluation: PACU Anesthesia Type: General Level of consciousness: awake and awake and alert Pain management: satisfactory to patient Vital Signs Assessment: post-procedure vital signs reviewed and stable Respiratory status: spontaneous breathing Cardiovascular status: blood pressure returned to baseline Anesthetic complications: no   No notable events documented.   Last Vitals:  Vitals:   09/24/23 0723 09/24/23 0851  BP: (!) 145/89 104/64  Pulse: 70 72  Resp: 18 10  Temp: (!) 36.2 C   SpO2: 96% 99%    Last Pain:  Vitals:   09/24/23 0723  TempSrc: Temporal  PainSc: 0-No pain                 VAN STAVEREN,Ornella Coderre

## 2023-09-24 NOTE — Transfer of Care (Signed)
Immediate Anesthesia Transfer of Care Note  Patient: Tracey Mosley  Procedure(s) Performed: COLONOSCOPY WITH PROPOFOL  Patient Location: PACU  Anesthesia Type:General  Level of Consciousness: sedated  Airway & Oxygen Therapy: Patient Spontanous Breathing and Patient connected to nasal cannula oxygen  Post-op Assessment: Report given to RN and Post -op Vital signs reviewed and stable  Post vital signs: Reviewed and stable  Last Vitals:  Vitals Value Taken Time  BP    Temp    Pulse    Resp    SpO2      Last Pain:  Vitals:   09/24/23 0723  TempSrc: Temporal  PainSc: 0-No pain         Complications: No notable events documented.

## 2023-09-24 NOTE — Interval H&P Note (Signed)
History and Physical Interval Note: Preprocedure H&P from 09/24/23  was reviewed and there was no interval change after seeing and examining the patient.  Written consent was obtained from the patient after discussion of risks, benefits, and alternatives. Patient has consented to proceed with Colonoscopy with possible intervention   09/24/2023 8:19 AM  Tracey Mosley  has presented today for surgery, with the diagnosis of Z12.11  - Colon cancer screening.  The various methods of treatment have been discussed with the patient and family. After consideration of risks, benefits and other options for treatment, the patient has consented to  Procedure(s): COLONOSCOPY WITH PROPOFOL (N/A) as a surgical intervention.  The patient's history has been reviewed, patient examined, no change in status, stable for surgery.  I have reviewed the patient's chart and labs.  Questions were answered to the patient's satisfaction.     Jaynie Collins

## 2023-09-24 NOTE — H&P (Signed)
Pre-Procedure H&P   Patient ID: Tracey Mosley is a 72 y.o. female.  Gastroenterology Provider: Jaynie Collins, DO  Referring Provider: Fransico Setters, NP PCP: Patrice Paradise, MD  Date: 09/24/2023  HPI Tracey Mosley is a 72 y.o. female who presents today for Colonoscopy for Colorectal cancer screening .  Last underwent colonoscopy in 2014 which demonstrated sigmoid diverticulosis and internal hemorrhoids.  Bowel movements are regular without melena or hematochezia.  FIT negative in October.  Hemoglobin 15.8 MCV 93 platelets 220,000  Status post cholecystectomy   Past Medical History:  Diagnosis Date   Abnormal ECG    a. inferior Q's, poor R progression - dates back to 06/2014; b. 05/2017 MV: No ischemia/infarct.   History of echocardiogram    a. 04/2017 Echo: EF 55-60%, nl RV size/fxn.   History of kidney stones    Hyperlipidemia    Hypertension    Hypothyroidism    Microscopic hematuria    Osteoporosis    Thyroid disease     Past Surgical History:  Procedure Laterality Date   CHOLECYSTECTOMY     COLONOSCOPY     EXTRACORPOREAL SHOCK WAVE LITHOTRIPSY Left 09/09/2017   Procedure: EXTRACORPOREAL SHOCK WAVE LITHOTRIPSY (ESWL);  Surgeon: Riki Altes, MD;  Location: [facility];  Service: Urology;  Laterality: Left;   EXTRACORPOREAL SHOCK WAVE LITHOTRIPSY Left 09/16/2017   Procedure: EXTRACORPOREAL SHOCK WAVE LITHOTRIPSY (ESWL);  Surgeon: Vanna Scotland, MD;  Location: [facility];  Service: Urology;  Laterality: Left;   LITHOTRIPSY      Family History No h/o GI disease or malignancy  Review of Systems  Constitutional:  Negative for activity change, appetite change, chills, diaphoresis, fatigue, fever and unexpected weight change.  HENT:  Negative for trouble swallowing and voice change.   Respiratory:  Negative for shortness of breath and wheezing.   Cardiovascular:  Negative for chest pain, palpitations and leg swelling.  Gastrointestinal:  Negative  for abdominal distention, abdominal pain, anal bleeding, blood in stool, constipation, diarrhea, nausea, rectal pain and vomiting.  Musculoskeletal:  Negative for arthralgias and myalgias.  Skin:  Negative for color change and pallor.  Neurological:  Negative for dizziness, syncope and weakness.  Psychiatric/Behavioral:  Negative for confusion.   All other systems reviewed and are negative.    Medications No current facility-administered medications on file prior to encounter.   Current Outpatient Medications on File Prior to Encounter  Medication Sig Dispense Refill   amLODipine (NORVASC) 5 MG tablet TAKE 1 TABLET(5 MG) BY MOUTH DAILY 90 tablet 3   escitalopram (LEXAPRO) 20 MG tablet TAKE 1 TABLET BY MOUTH ONCE DAILY     fluticasone (FLONASE) 50 MCG/ACT nasal spray Place 1 spray into both nostrils daily.      hydrochlorothiazide (HYDRODIURIL) 25 MG tablet TAKE 1 TABLET BY MOUTH  DAILY 90 tablet 2   levothyroxine (SYNTHROID, LEVOTHROID) 50 MCG tablet Take 50 mcg by mouth daily before breakfast.      meloxicam (MOBIC) 7.5 MG tablet Take 7.5 mg by mouth daily as needed for pain.     Multiple Vitamin (MULTI-VITAMINS) TABS Take 1 tablet by mouth daily.     aspirin EC 81 MG tablet Take 1 tablet (81 mg total) by mouth daily. Swallow whole. (Patient not taking: Reported on 09/15/2023) 30 tablet 12   Cyanocobalamin 1000 MCG/ML KIT Inject as directed every 30 (thirty) days.      Pertinent medications related to GI and procedure were reviewed by me with the patient prior to  the procedure   Current Facility-Administered Medications:    0.9 %  sodium chloride infusion, , Intravenous, Continuous, Jaynie Collins, DO, Last Rate: 20 mL/hr at 09/24/23 0734, New Bag at 09/24/23 0734  sodium chloride 20 mL/hr at 09/24/23 1610       Allergies  Allergen Reactions   Codeine Nausea Only   Codeine Sulfate Nausea Only   Epinephrine Other (See Comments)    Tremors   Sulfa Antibiotics Nausea And  Vomiting   Allergies were reviewed by me prior to the procedure  Objective   Body mass index is 30.8 kg/m. Vitals:   09/24/23 0723  BP: (!) 145/89  Pulse: 70  Resp: 18  Temp: (!) 97.2 F (36.2 C)  TempSrc: Temporal  SpO2: 96%  Weight: 86.5 kg  Height: 5\' 6"  (1.676 m)     Physical Exam Vitals and nursing note reviewed.  Constitutional:      General: She is not in acute distress.    Appearance: Normal appearance. She is not ill-appearing, toxic-appearing or diaphoretic.  HENT:     Head: Normocephalic and atraumatic.     Nose: Nose normal.     Mouth/Throat:     Mouth: Mucous membranes are moist.     Pharynx: Oropharynx is clear.  Eyes:     General: No scleral icterus.    Extraocular Movements: Extraocular movements intact.  Cardiovascular:     Rate and Rhythm: Normal rate and regular rhythm.     Heart sounds: Normal heart sounds. No murmur heard.    No friction rub. No gallop.  Pulmonary:     Effort: Pulmonary effort is normal. No respiratory distress.     Breath sounds: Normal breath sounds. No wheezing, rhonchi or rales.  Abdominal:     General: Bowel sounds are normal. There is no distension.     Palpations: Abdomen is soft.     Tenderness: There is no abdominal tenderness. There is no guarding or rebound.  Musculoskeletal:     Cervical back: Neck supple.     Right lower leg: No edema.     Left lower leg: No edema.  Skin:    General: Skin is warm and dry.     Coloration: Skin is not jaundiced or pale.  Neurological:     General: No focal deficit present.     Mental Status: She is alert and oriented to person, place, and time. Mental status is at baseline.  Psychiatric:        Mood and Affect: Mood normal.        Behavior: Behavior normal.        Thought Content: Thought content normal.        Judgment: Judgment normal.      Assessment:  Tracey Mosley is a 72 y.o. female  who presents today for Colonoscopy for Colorectal cancer screening  .  Plan:  Colonoscopy with possible intervention today  Colonoscopy with possible biopsy, control of bleeding, polypectomy, and interventions as necessary has been discussed with the patient/patient representative. Informed consent was obtained from the patient/patient representative after explaining the indication, nature, and risks of the procedure including but not limited to death, bleeding, perforation, missed neoplasm/lesions, cardiorespiratory compromise, and reaction to medications. Opportunity for questions was given and appropriate answers were provided. Patient/patient representative has verbalized understanding is amenable to undergoing the procedure.   Jaynie Collins, DO  [facility]  Portions of the record may have been created with voice recognition software. Occasional  wrong-word or 'sound-a-like' substitutions may have occurred due to the inherent limitations of voice recognition software.  Read the chart carefully and recognize, using context, where substitutions may have occurred.

## 2023-09-24 NOTE — Anesthesia Preprocedure Evaluation (Signed)
Anesthesia Evaluation  Patient identified by MRN, date of birth, ID band Patient awake    Reviewed: Allergy & Precautions, NPO status , Patient's Chart, lab work & pertinent test results  Airway Mallampati: III  TM Distance: >3 FB Neck ROM: full    Dental  (+) Teeth Intact   Pulmonary neg pulmonary ROS   Pulmonary exam normal breath sounds clear to auscultation       Cardiovascular Exercise Tolerance: Good hypertension, Pt. on medications negative cardio ROS Normal cardiovascular exam Rhythm:Regular Rate:Normal     Neuro/Psych  Headaches   Depression    negative neurological ROS  negative psych ROS   GI/Hepatic negative GI ROS, Neg liver ROS,,,  Endo/Other  negative endocrine ROSHypothyroidism    Renal/GU negative Renal ROS  negative genitourinary   Musculoskeletal   Abdominal  (+) + obese  Peds negative pediatric ROS (+)  Hematology negative hematology ROS (+)   Anesthesia Other Findings Past Medical History: No date: Abnormal ECG     Comment:  a. inferior Q's, poor R progression - dates back to               06/2014; b. 05/2017 MV: No ischemia/infarct. No date: History of echocardiogram     Comment:  a. 04/2017 Echo: EF 55-60%, nl RV size/fxn. No date: History of kidney stones No date: Hyperlipidemia No date: Hypertension No date: Hypothyroidism No date: Microscopic hematuria No date: Osteoporosis No date: Thyroid disease  Past Surgical History: No date: CHOLECYSTECTOMY No date: COLONOSCOPY 09/09/2017: EXTRACORPOREAL SHOCK WAVE LITHOTRIPSY; Left     Comment:  Procedure: EXTRACORPOREAL SHOCK WAVE LITHOTRIPSY (ESWL);              Surgeon: Riki Altes, MD;  Location: [facility];                Service: Urology;  Laterality: Left; 09/16/2017: EXTRACORPOREAL SHOCK WAVE LITHOTRIPSY; Left     Comment:  Procedure: EXTRACORPOREAL SHOCK WAVE LITHOTRIPSY (ESWL);              Surgeon: Vanna Scotland, MD;   Location: [facility];                Service: Urology;  Laterality: Left; No date: LITHOTRIPSY  BMI    Body Mass Index: 30.80 kg/m      Reproductive/Obstetrics negative OB ROS                             Anesthesia Physical Anesthesia Plan  ASA: 2  Anesthesia Plan: General   Post-op Pain Management:    Induction: Intravenous  PONV Risk Score and Plan: Propofol infusion and TIVA  Airway Management Planned: Natural Airway and Nasal Cannula  Additional Equipment:   Intra-op Plan:   Post-operative Plan:   Informed Consent: I have reviewed the patients History and Physical, chart, labs and discussed the procedure including the risks, benefits and alternatives for the proposed anesthesia with the patient or authorized representative who has indicated his/her understanding and acceptance.     Dental Advisory Given  Plan Discussed with: CRNA  Anesthesia Plan Comments:        Anesthesia Quick Evaluation

## 2023-09-24 NOTE — Op Note (Signed)
[facility] Gastroenterology Patient Name: Tracey Mosley Procedure Date: 09/24/2023 8:13 AM MRN: [id number] Account #: [id number] Date of Birth: Jul 06, 1951 Admit Type: Outpatient Age: 72 Room: [facility] ENDO ROOM 1 Gender: Female Note Status: Finalized Instrument Name: Colonscope [id number] Procedure:             Colonoscopy Indications:           Screening for colorectal malignant neoplasm Providers:             Jaynie Collins DO, DO Referring MD:          Marilynne Halsted, MD (Referring MD) Medicines:             Monitored Anesthesia Care Complications:         No immediate complications. Estimated blood loss:                         Minimal. Procedure:             Pre-Anesthesia Assessment:                        - Prior to the procedure, a History and Physical was                         performed, and patient medications and allergies were                         reviewed. The patient is competent. The risks and                         benefits of the procedure and the sedation options and                         risks were discussed with the patient. All questions                         were answered and informed consent was obtained.                         Patient identification and proposed procedure were                         verified by the physician, the nurse, the anesthetist                         and the technician in the endoscopy suite. Mental                         Status Examination: alert and oriented. Airway                         Examination: normal oropharyngeal airway and neck                         mobility. Respiratory Examination: clear to                         auscultation. CV Examination: RRR, no murmurs, no S3  or S4. Prophylactic Antibiotics: The patient does not                         require prophylactic antibiotics. Prior                         Anticoagulants: The patient has taken no  anticoagulant                         or antiplatelet agents. ASA Grade Assessment: II - A                         patient with mild systemic disease. After reviewing                         the risks and benefits, the patient was deemed in                         satisfactory condition to undergo the procedure. The                         anesthesia plan was to use monitored anesthesia care                         (MAC). Immediately prior to administration of                         medications, the patient was re-assessed for adequacy                         to receive sedatives. The heart rate, respiratory                         rate, oxygen saturations, blood pressure, adequacy of                         pulmonary ventilation, and response to care were                         monitored throughout the procedure. The physical                         status of the patient was re-assessed after the                         procedure.                        After obtaining informed consent, the colonoscope was                         passed under direct vision. Throughout the procedure,                         the patient's blood pressure, pulse, and oxygen                         saturations were monitored continuously. The  Colonoscope was introduced through the anus and                         advanced to the the cecum, identified by appendiceal                         orifice and ileocecal valve. The colonoscopy was                         performed without difficulty. The patient tolerated                         the procedure well. The quality of the bowel                         preparation was evaluated using the BBPS [facility] Bowel                         Preparation Scale) with scores of: Right Colon = 3,                         Transverse Colon = 3 and Left Colon = 3 (entire mucosa                         seen well with no residual staining, small fragments                          of stool or opaque liquid). The total BBPS score                         equals 9. The ileocecal valve, appendiceal orifice,                         and rectum were photographed. Findings:      The perianal and digital rectal examinations were normal. Pertinent       negatives include normal sphincter tone.      Five sessile polyps were found in the sigmoid colon (2) and descending       colon (3). The polyps were 1 to 2 mm in size. These polyps were removed       with a jumbo cold forceps. Resection and retrieval were complete.       Estimated blood loss was minimal.      Multiple small-mouthed diverticula were found in the left colon.       Estimated blood loss: none.      Non-bleeding internal hemorrhoids were found during retroflexion. The       hemorrhoids were Grade I (internal hemorrhoids that do not prolapse).       Estimated blood loss: none.      The exam was otherwise without abnormality on direct and retroflexion       views. Impression:            - Five 1 to 2 mm polyps in the sigmoid colon and in                         the descending colon, removed with a jumbo cold  forceps. Resected and retrieved.                        - Diverticulosis in the left colon.                        - Non-bleeding internal hemorrhoids.                        - The examination was otherwise normal on direct and                         retroflexion views. Recommendation:        - Patient has a contact number available for                         emergencies. The signs and symptoms of potential                         delayed complications were discussed with the patient.                         Return to normal activities tomorrow. Written                         discharge instructions were provided to the patient.                        - Discharge patient to home.                        - Resume previous diet.                        -  Continue present medications.                        - No ibuprofen, naproxen, or other non-steroidal                         anti-inflammatory drugs for 5 days after polyp removal.                        - Await pathology results.                        - Repeat colonoscopy for surveillance based on                         pathology results.                        - Return to referring physician as previously                         scheduled.                        - The findings and recommendations were discussed with                         the patient. Procedure Code(s):     ---  Professional ---                        [phone], Colonoscopy, flexible; with biopsy, single or                         multiple Diagnosis Code(s):     --- Professional ---                        Z12.11, Encounter for screening for malignant neoplasm                         of colon                        D12.5, Benign neoplasm of sigmoid colon                        D12.4, Benign neoplasm of descending colon                        K64.0, First degree hemorrhoids                        K57.30, Diverticulosis of large intestine without                         perforation or abscess without bleeding CPT copyright 2022 American Medical Association. All rights reserved. The codes documented in this report are preliminary and upon coder review may  be revised to meet current compliance requirements. Attending Participation:      I personally performed the entire procedure. Elfredia Nevins, DO Jaynie Collins DO, DO 09/24/2023 8:51:11 AM This report has been signed electronically. Number of Addenda: 0 Note Initiated On: 09/24/2023 8:13 AM Scope Withdrawal Time: 0 hours 11 minutes 53 seconds  Total Procedure Duration: 0 hours 18 minutes 15 seconds  Estimated Blood Loss:  Estimated blood loss was minimal.      [facility]

## 2023-09-27 ENCOUNTER — Encounter: Payer: Self-pay | Admitting: Gastroenterology

## 2023-09-27 LAB — SURGICAL PATHOLOGY

## 2023-10-21 ENCOUNTER — Other Ambulatory Visit: Payer: Self-pay | Admitting: Internal Medicine

## 2023-10-22 NOTE — Progress Notes (Deleted)
 11/04/2018  11:19 AM   Tracey Mosley 11-23-1950 [id number]  Referring provider: Patrice Paradise, MD [address] [facility] [place],  [place] [address]  Urological history: 1. High risk hematuria -non-smoker -cysto NED 2019  -CTU 01/2019 The adrenal glands appear normal. Stone within the inferior pole of left kidney measures 5 mm, image 32/2.  No right renal calculi. No hydronephrosis identified bilaterally. No kidney mass. Urinary bladder appears within normal limits -cysto, 2022 - NED  -no reports of gross heme -UA (04/2022) negative for micro heme  2. Nephrolithiasis -stone composition of 47% calcium phosphate carbonate, 38% calcium oxalate dihydrate and 15% calcium oxalate monohydrate -ESWL x 3 -KUB (08/25/2022) 5 mm left lower pole renal stone   No chief complaint on file.   HPI: Tracey Mosley is a 73 y.o. female who presents today for a yearly follow up.   She is having no issues with renal colic or passage of fragments.  Patient denies any modifying or aggravating factors.  Patient denies any gross hematuria, dysuria or suprapubic/flank pain.  Patient denies any fevers, chills, nausea or vomiting.    She is suffering with bronchitis and is now on antibiotics.  She has noticed a worsening of her SUI with the bronchitis.    KUB stable left renal stone  PMH: Past Medical History:  Diagnosis Date   Abnormal ECG    a. inferior Q's, poor R progression - dates back to 06/2014; b. 05/2017 MV: No ischemia/infarct.   History of echocardiogram    a. 04/2017 Echo: EF 55-60%, nl RV size/fxn.   History of kidney stones    Hyperlipidemia    Hypertension    Hypothyroidism    Microscopic hematuria    Osteoporosis    Thyroid disease     Surgical History: Past Surgical History:  Procedure Laterality Date   CHOLECYSTECTOMY     COLONOSCOPY     COLONOSCOPY WITH PROPOFOL N/A 09/24/2023   Procedure: COLONOSCOPY WITH PROPOFOL;  Surgeon: Jaynie Collins, DO;  Location: [facility] ENDOSCOPY;  Service: Gastroenterology;  Laterality: N/A;   EXTRACORPOREAL SHOCK WAVE LITHOTRIPSY Left 09/09/2017   Procedure: EXTRACORPOREAL SHOCK WAVE LITHOTRIPSY (ESWL);  Surgeon: Riki Altes, MD;  Location: [facility] ORS;  Service: Urology;  Laterality: Left;   EXTRACORPOREAL SHOCK WAVE LITHOTRIPSY Left 09/16/2017   Procedure: EXTRACORPOREAL SHOCK WAVE LITHOTRIPSY (ESWL);  Surgeon: Vanna Scotland, MD;  Location: [facility] ORS;  Service: Urology;  Laterality: Left;   LITHOTRIPSY     POLYPECTOMY  09/24/2023   Procedure: POLYPECTOMY;  Surgeon: Jaynie Collins, DO;  Location: [facility] ENDOSCOPY;  Service: Gastroenterology;;    Home Medications:  Allergies as of 10/26/2023       Reactions   Codeine Nausea Only   Codeine Sulfate Nausea Only   Epinephrine Other (See Comments)   Tremors   Sulfa Antibiotics Nausea And Vomiting        Medication List        Accurate as of October 22, 2023 11:19 AM. If you have any questions, ask your nurse or doctor.          amLODipine 5 MG tablet Commonly known as: NORVASC TAKE 1 TABLET(5 MG) BY MOUTH DAILY   aspirin EC 81 MG tablet Take 1 tablet (81 mg total) by mouth daily. Swallow whole.   Cyanocobalamin 1000 MCG/ML Kit Inject as directed every 30 (thirty) days.   escitalopram 20 MG tablet Commonly known as: LEXAPRO TAKE 1 TABLET BY MOUTH ONCE DAILY  fluticasone 50 MCG/ACT nasal spray Commonly known as: FLONASE Place 1 spray into both nostrils daily.   hydrochlorothiazide 25 MG tablet Commonly known as: HYDRODIURIL TAKE 1 TABLET BY MOUTH  DAILY   levothyroxine 50 MCG tablet Commonly known as: SYNTHROID Take 50 mcg by mouth daily before breakfast.   meloxicam 7.5 MG tablet Commonly known as: MOBIC Take 7.5 mg by mouth daily as needed for pain.   Multi-Vitamins Tabs Take 1 tablet by mouth daily.   simvastatin 20 MG tablet Commonly known as: ZOCOR TAKE 1 TABLET(20 MG) BY MOUTH DAILY    valsartan 320 MG tablet Commonly known as: DIOVAN TAKE 1 TABLET(320 MG) BY MOUTH DAILY        Allergies:  Allergies  Allergen Reactions   Codeine Nausea Only   Codeine Sulfate Nausea Only   Epinephrine Other (See Comments)    Tremors   Sulfa Antibiotics Nausea And Vomiting    Family History: Family History  Problem Relation Age of Onset   Cancer Brother        Abdomina   Heart disease Mother        leaky valves   Heart attack Father 62   Aneurysm Father    Breast cancer Neg Hx     Social History:  reports that she has never smoked. She has never used smokeless tobacco. She reports that she does not currently use alcohol after a past usage of about 2.0 standard drinks of alcohol per week. She reports that she does not use drugs.  ROS: For pertinent review of systems please refer to history of present illness  Physical Exam: There were no vitals taken for this visit.  Constitutional:  Well nourished. Alert and oriented, No acute distress. HEENT: [place] AT, moist mucus membranes.  Trachea midline Cardiovascular: No clubbing, cyanosis, or edema. Respiratory: Normal respiratory effort, no increased work of breathing. Neurologic: Grossly intact, no focal deficits, moving all 4 extremities. Psychiatric: Normal mood and affect.    Laboratory Data: Serum creatinine (04/2022) 0.9 Hemoglobin A1c (04/2022) 5.6% Urinalysis Color Yellow, Violet, Light Violet, Dark Violet Yellow  Clarity Clear, Other Clear  Specific Gravity 1.000 - 1.030 1.020  pH, Urine 5.0 - 8.0 6.0  Protein, Urinalysis Negative, Trace mg/dL Negative  Glucose, Urinalysis Negative mg/dL Negative  Ketones, Urinalysis Negative mg/dL Negative  Blood, Urinalysis Negative Negative  Nitrite, Urinalysis Negative Negative  Leukocyte Esterase, Urinalysis Negative Negative  White Blood Cells, Urinalysis None Seen, 0-3 /hpf 0-3  Red Blood Cells, Urinalysis None Seen, 0-3 /hpf None Seen  Bacteria, Urinalysis None Seen  /hpf Rare Abnormal   Squamous Epithelial Cells, Urinalysis Rare, Few, None Seen /hpf Rare  Resulting Agency  [facility]   Specimen Collected: 04/29/22 09:04   Performed by: [facility] Last Resulted: 04/29/22 09:57  Received From: [facility]  Result Received: 06/24/22 11:48  I have reviewed the labs.  Pertinent Imaging: CLINICAL DATA:  Follow-up of renal stones   EXAM: ABDOMEN - 1 VIEW   COMPARISON:  Abdominal radiograph dated 01/29/2021   FINDINGS: Nonobstructive bowel gas pattern. No free air or pneumatosis. Right upper quadrant surgical clips. Similar 5 mm left lower pole stone. Otherwise, no definite radiopaque calculi. No acute or substantial osseous abnormality. The sacrum and coccyx are partially obscured by overlying bowel contents.   Partially imaged lung bases are clear.   IMPRESSION: Similar 5 mm left lower pole renal stone. Otherwise, no definite radiopaque calculi.  Electronically Signed   By: Agustin Cree M.D.   On: 08/26/2022 10:52 I have independently reviewed the films.  See HPI.     Assessment & Plan:    1. High risk hematuria -non- smoker -CTU, 2020 - NED -cysto 2022 - NED -no reports of gross heme -UA neg for micro heme   2. Left lower pole stone - no passage of fragment - no renal colic   3. SUI -Worsening secondary to an episode of severe bronchitis -She will contact us if it continues to persist after the bronchitis is resolved  4.  Pulmonary nodule  No follow-ups on file.  [name]  [facility] [address], [address] [place], [place] [address] [phone]

## 2023-10-26 ENCOUNTER — Ambulatory Visit: Payer: Medicare Other | Admitting: Urology

## 2023-11-24 ENCOUNTER — Other Ambulatory Visit: Payer: Self-pay | Admitting: Physician Assistant

## 2023-11-24 DIAGNOSIS — R7303 Prediabetes: Secondary | ICD-10-CM | POA: Insufficient documentation

## 2023-11-24 DIAGNOSIS — Z1231 Encounter for screening mammogram for malignant neoplasm of breast: Secondary | ICD-10-CM

## 2023-11-24 NOTE — Progress Notes (Unsigned)
 11/04/2018  9:03 AM   [name] Dec 27, 1950 [id number]  Referring provider: Patrice Paradise, MD [address] [facility] [place],  [place] [address]  Urological history: 1. High risk hematuria -non-smoker -cysto NED 2019  -CTU 01/2019 The adrenal glands appear normal. Stone within the inferior pole of left kidney measures 5 mm, image 32/2.  No right renal calculi. No hydronephrosis identified bilaterally. No kidney mass. Urinary bladder appears within normal limits -cysto, 2022 - NED   2. Nephrolithiasis -stone composition of 47% calcium phosphate carbonate, 38% calcium oxalate dihydrate and 15% calcium oxalate monohydrate -ESWL x 3 -KUB (08/25/2022) 5 mm left lower pole renal stone   No chief complaint on file.   HPI: Tracey Mosley is a 73 y.o. female who presents today for a yearly follow up.   Previous records reviewed.     UA ***  PVR ***   PMH: Past Medical History:  Diagnosis Date   Abnormal ECG    a. inferior Q's, poor R progression - dates back to 06/2014; b. 05/2017 MV: No ischemia/infarct.   History of echocardiogram    a. 04/2017 Echo: EF 55-60%, nl RV size/fxn.   History of kidney stones    Hyperlipidemia    Hypertension    Hypothyroidism    Microscopic hematuria    Osteoporosis    Thyroid disease     Surgical History: Past Surgical History:  Procedure Laterality Date   CHOLECYSTECTOMY     COLONOSCOPY     COLONOSCOPY WITH PROPOFOL N/A 09/24/2023   Procedure: COLONOSCOPY WITH PROPOFOL;  Surgeon: Jaynie Collins, DO;  Location: [facility] ENDOSCOPY;  Service: Gastroenterology;  Laterality: N/A;   EXTRACORPOREAL SHOCK WAVE LITHOTRIPSY Left 09/09/2017   Procedure: EXTRACORPOREAL SHOCK WAVE LITHOTRIPSY (ESWL);  Surgeon: Riki Altes, MD;  Location: [facility] ORS;  Service: Urology;  Laterality: Left;   EXTRACORPOREAL SHOCK WAVE LITHOTRIPSY Left 09/16/2017   Procedure: EXTRACORPOREAL SHOCK WAVE LITHOTRIPSY (ESWL);  Surgeon:  Vanna Scotland, MD;  Location: [facility] ORS;  Service: Urology;  Laterality: Left;   LITHOTRIPSY     POLYPECTOMY  09/24/2023   Procedure: POLYPECTOMY;  Surgeon: Jaynie Collins, DO;  Location: [facility] ENDOSCOPY;  Service: Gastroenterology;;    Home Medications:  Allergies as of 11/25/2023       Reactions   Codeine Nausea Only   Codeine Sulfate Nausea Only   Epinephrine Other (See Comments)   Tremors   Sulfa Antibiotics Nausea And Vomiting        Medication List        Accurate as of November 24, 2023  9:03 AM. If you have any questions, ask your nurse or doctor.          amLODipine 5 MG tablet Commonly known as: NORVASC TAKE 1 TABLET(5 MG) BY MOUTH DAILY   aspirin EC 81 MG tablet Take 1 tablet (81 mg total) by mouth daily. Swallow whole.   Cyanocobalamin 1000 MCG/ML Kit Inject as directed every 30 (thirty) days.   escitalopram 20 MG tablet Commonly known as: LEXAPRO TAKE 1 TABLET BY MOUTH ONCE DAILY   fluticasone 50 MCG/ACT nasal spray Commonly known as: FLONASE Place 1 spray into both nostrils daily.   hydrochlorothiazide 25 MG tablet Commonly known as: HYDRODIURIL TAKE 1 TABLET BY MOUTH  DAILY   levothyroxine 50 MCG tablet Commonly known as: SYNTHROID Take 50 mcg by mouth daily before breakfast.   meloxicam 7.5 MG tablet Commonly known as: MOBIC Take 7.5 mg by mouth daily as  needed for pain.   Multi-Vitamins Tabs Take 1 tablet by mouth daily.   simvastatin 20 MG tablet Commonly known as: ZOCOR TAKE 1 TABLET(20 MG) BY MOUTH DAILY   valsartan 320 MG tablet Commonly known as: DIOVAN TAKE 1 TABLET(320 MG) BY MOUTH DAILY        Allergies:  Allergies  Allergen Reactions   Codeine Nausea Only   Codeine Sulfate Nausea Only   Epinephrine Other (See Comments)    Tremors   Sulfa Antibiotics Nausea And Vomiting    Family History: Family History  Problem Relation Age of Onset   Cancer Brother        Abdomina   Heart disease Mother         leaky valves   Heart attack Father 20   Aneurysm Father    Breast cancer Neg Hx     Social History:  reports that she has never smoked. She has never used smokeless tobacco. She reports that she does not currently use alcohol after a past usage of about 2.0 standard drinks of alcohol per week. She reports that she does not use drugs.  ROS: For pertinent review of systems please refer to history of present illness  Physical Exam: There were no vitals taken for this visit.  Constitutional:  Well nourished. Alert and oriented, No acute distress. HEENT: [place] AT, moist mucus membranes.  Trachea midline, no masses. Cardiovascular: No clubbing, cyanosis, or edema. Respiratory: Normal respiratory effort, no increased work of breathing. GU: No CVA tenderness.  No bladder fullness or masses.  Recession of labia minora, dry, pale vulvar vaginal mucosa and loss of mucosal ridges and folds.  Normal urethral meatus, no lesions, no prolapse, no discharge.   No urethral masses, tenderness and/or tenderness. No bladder fullness, tenderness or masses. *** vagina mucosa, *** estrogen effect, no discharge, no lesions, *** pelvic support, *** cystocele and *** rectocele noted.  No cervical motion tenderness.  Uterus is freely mobile and non-fixed.  No adnexal/parametria masses or tenderness noted.  Anus and perineum are without rashes or lesions.   ***  Neurologic: Grossly intact, no focal deficits, moving all 4 extremities. Psychiatric: Normal mood and affect.    Laboratory Data: Urinalysis See EPIC and HPI I have reviewed the labs.  Pertinent Imaging: N/A   Assessment & Plan:    1. High risk hematuria -non- smoker -CTU, 2020 - NED -cysto 2022 - NED -no reports of gross heme -UA ***  2. Left lower pole stone - no passage of fragment - no renal colic   3. SUI -Worsening secondary to an episode of severe bronchitis -She will contact us if it continues to persist after the bronchitis is  resolved  4.  Pulmonary nodule -will order a chest CT for further evaluation ***  No follow-ups on file.  [name]  [facility] [address] [place], [place] [address] [phone]

## 2023-11-25 ENCOUNTER — Encounter: Payer: Self-pay | Admitting: Urology

## 2023-11-25 ENCOUNTER — Ambulatory Visit: Payer: Self-pay | Admitting: Urology

## 2023-11-25 VITALS — BP 123/78 | HR 77 | Ht 66.0 in | Wt 190.0 lb

## 2023-11-25 DIAGNOSIS — R319 Hematuria, unspecified: Secondary | ICD-10-CM

## 2023-11-25 DIAGNOSIS — N393 Stress incontinence (female) (male): Secondary | ICD-10-CM | POA: Diagnosis not present

## 2023-11-25 DIAGNOSIS — N200 Calculus of kidney: Secondary | ICD-10-CM | POA: Diagnosis not present

## 2023-11-25 DIAGNOSIS — R911 Solitary pulmonary nodule: Secondary | ICD-10-CM

## 2023-11-25 LAB — URINALYSIS, COMPLETE
Bilirubin, UA: NEGATIVE
Glucose, UA: NEGATIVE
Ketones, UA: NEGATIVE
Nitrite, UA: NEGATIVE
Protein,UA: NEGATIVE
RBC, UA: NEGATIVE
Specific Gravity, UA: 1.025 (ref 1.005–1.030)
Urobilinogen, Ur: 0.2 mg/dL (ref 0.2–1.0)
pH, UA: 5.5 (ref 5.0–7.5)

## 2023-11-25 LAB — BLADDER SCAN AMB NON-IMAGING: Scan Result: 0

## 2023-11-25 LAB — MICROSCOPIC EXAMINATION

## 2023-11-26 ENCOUNTER — Other Ambulatory Visit: Payer: Self-pay | Admitting: Physician Assistant

## 2023-11-26 DIAGNOSIS — I10 Essential (primary) hypertension: Secondary | ICD-10-CM

## 2023-11-26 DIAGNOSIS — R911 Solitary pulmonary nodule: Secondary | ICD-10-CM

## 2023-12-01 LAB — LIPID PANEL
Chol/HDL Ratio: 4 ratio (ref 0.0–4.4)
Cholesterol, Total: 178 mg/dL (ref 100–199)
HDL: 45 mg/dL (ref >39)
LDL Chol Calc (NIH): 102 mg/dL — ABNORMAL HIGH (ref 0–99)
Triglycerides: 176 mg/dL — ABNORMAL HIGH (ref 0–149)
VLDL Cholesterol Cal: 31 mg/dL (ref 5–40)

## 2023-12-03 ENCOUNTER — Telehealth: Payer: Self-pay | Admitting: Internal Medicine

## 2023-12-03 ENCOUNTER — Other Ambulatory Visit: Payer: Self-pay

## 2023-12-03 DIAGNOSIS — E782 Mixed hyperlipidemia: Secondary | ICD-10-CM

## 2023-12-03 MED ORDER — ROSUVASTATIN CALCIUM 20 MG PO TABS: 20.0000 mg | ORAL_TABLET | Freq: Every day | ORAL | 3 refills | Status: AC

## 2023-12-03 NOTE — Telephone Encounter (Signed)
 Patient is returning call to discuss labs.

## 2023-12-03 NOTE — Telephone Encounter (Signed)
 Called patient back, made aware of results below.   Patient requested to take Rosuvastatin 20 mg daily- sent this over to RX discontinued Simvastatin, ordered LIPID/LIVER in 3 months. Patient aware blood work is needed.  Thanks!   [name], MD 12/02/2023  1:30 PM EST     Please let Ms. Secrist know that her LDL remains a little bit above goal at 102.  I recommend that we consider increasing her dose of simvastatin to 40 mg daily or transitioning to a higher intensity statin such as rosuvastatin 20 mg daily.  If Ms. Inoue wishes to pursue one of these changes, we will need to obtain a follow-up lipid panel and ALT in about 3 months.

## 2023-12-20 ENCOUNTER — Ambulatory Visit
Admission: RE | Admit: 2023-12-20 | Discharge: 2023-12-20 | Disposition: A | Discharge status: Home / Self Care | Source: Ambulatory Visit | Attending: Physician Assistant | Admitting: Physician Assistant

## 2023-12-20 DIAGNOSIS — R911 Solitary pulmonary nodule: Secondary | ICD-10-CM | POA: Insufficient documentation

## 2023-12-20 DIAGNOSIS — I10 Essential (primary) hypertension: Secondary | ICD-10-CM | POA: Diagnosis present

## 2023-12-24 ENCOUNTER — Ambulatory Visit
Admission: RE | Admit: 2023-12-24 | Discharge: 2023-12-24 | Disposition: A | Discharge status: Home / Self Care | Payer: Medicare Other | Source: Ambulatory Visit | Attending: Physician Assistant | Admitting: Physician Assistant

## 2023-12-24 DIAGNOSIS — Z1231 Encounter for screening mammogram for malignant neoplasm of breast: Secondary | ICD-10-CM | POA: Insufficient documentation

## 2024-01-05 ENCOUNTER — Other Ambulatory Visit: Payer: Self-pay | Admitting: Internal Medicine

## 2024-01-16 ENCOUNTER — Other Ambulatory Visit: Payer: Self-pay | Admitting: Internal Medicine

## 2024-03-31 ENCOUNTER — Other Ambulatory Visit: Payer: Self-pay | Admitting: Internal Medicine

## 2024-04-03 ENCOUNTER — Telehealth: Payer: Self-pay | Admitting: Internal Medicine

## 2024-04-03 NOTE — Telephone Encounter (Signed)
?*  STAT* If patient is at the pharmacy, call can be transferred to refill team. ? ? ?1. Which medications need to be refilled? (please list name of each medication and dose if known) amLODipine (NORVASC) 5 MG tablet ? ?2. Which pharmacy/location (including street and city if local pharmacy) is medication to be sent to?[id number] - [place], [place] [facility] OF [address] ? ?3. Do they need a 30 day or 90 day supply? 90 day ? ?

## 2024-04-23 ENCOUNTER — Other Ambulatory Visit: Payer: Self-pay | Admitting: Internal Medicine

## 2024-04-24 ENCOUNTER — Telehealth: Payer: Self-pay | Admitting: Internal Medicine

## 2024-04-24 NOTE — Telephone Encounter (Signed)
 Pt c/o swelling/edema: STAT if pt has developed SOB within 24 hours  If swelling, where is the swelling located? Ankles   How much weight have you gained and in what time span? Na   Have you gained 2 pounds in a day or 5 pounds in a week? NA  Do you have a log of your daily weights (if so, list)? NA   Are you currently taking a fluid pill? Yes and it was changed on the last visit   Are you currently SOB? No  Have you traveled recently in a car or plane for an extended period of time? No   Best number [phone]

## 2024-04-25 ENCOUNTER — Encounter: Payer: Self-pay | Admitting: Internal Medicine

## 2024-04-25 ENCOUNTER — Telehealth: Payer: Self-pay | Admitting: Internal Medicine

## 2024-04-25 NOTE — Telephone Encounter (Addendum)
 Pt's phone call and Mychart message addressed; pt concerned about increase in swelling of her ankles; pt admits that she has been eating more salt and snacking because her activity is limited due to a broken shoulder; asked if she had been elevating her legs while sitting for extended periods of times and pt admits she has not been elevating her legs and to stay well hydrated. I also suggested compression socks, but pt states she could not tolerate them, especially in hot weather.  Explained that itching is a side effect of Crestor  only if she is having an allergic reaction and feel that would have occurred shortly after starting the medication.  Pt verbalizes understanding and states that she will start elevating her legs while sitting, which she states is the biggest part of her day now that she is injured and will work on increasing her fluid intake.  She is worried that if she arrives for her arthroplasty of the shoulder and her ankles are swollen they will not perform surgery; Pt states she will call back if in a week or two if no improvement is seen.

## 2024-04-25 NOTE — Telephone Encounter (Signed)
 Pt's issues address on MyChart and in 2nd telephone call.  See encounter dated 3/27 @ 1058a.

## 2024-04-25 NOTE — Telephone Encounter (Signed)
 Pt c/o swelling/edema: STAT if pt has developed SOB within 24 hours  If swelling, where is the swelling located? feet  How much weight have you gained and in what time span?   Have you gained 2 pounds in a day or 5 pounds in a week?   Do you have a log of your daily weights (if so, list)?   Are you currently taking a fluid pill? yes  Are you currently SOB? No- patient says she does not know if she needs to be seen  Have you traveled recently in a car or plane for an extended period of time? no

## 2024-04-25 NOTE — Telephone Encounter (Signed)
 Returned pt call; no answer; LM for callback @ 825am

## 2024-04-27 NOTE — Telephone Encounter (Signed)
 Sounds good. Thanks

## 2024-05-24 ENCOUNTER — Ambulatory Visit: Payer: Medicare Other | Admitting: Urology

## 2024-06-28 ENCOUNTER — Ambulatory Visit: Attending: Internal Medicine | Admitting: Internal Medicine

## 2024-06-28 ENCOUNTER — Encounter: Payer: Self-pay | Admitting: Internal Medicine

## 2024-06-28 VITALS — BP 138/88 | HR 73 | Wt 192.0 lb

## 2024-06-28 DIAGNOSIS — I2510 Atherosclerotic heart disease of native coronary artery without angina pectoris: Secondary | ICD-10-CM | POA: Diagnosis not present

## 2024-06-28 DIAGNOSIS — I10 Essential (primary) hypertension: Secondary | ICD-10-CM | POA: Insufficient documentation

## 2024-06-28 DIAGNOSIS — E782 Mixed hyperlipidemia: Secondary | ICD-10-CM | POA: Diagnosis present

## 2024-06-28 DIAGNOSIS — R0609 Other forms of dyspnea: Secondary | ICD-10-CM

## 2024-06-28 NOTE — Patient Instructions (Signed)
 Medication Instructions:  Your physician recommends that you continue on your current medications as directed. Please refer to the Current Medication list given to you today.    *If you need a refill on your cardiac medications before your next appointment, please call your pharmacy*  Lab Work: No labs ordered today    Testing/Procedures: No test ordered today   Follow-Up: At [facility], you and your health needs are our priority.  As part of our continuing mission to provide you with exceptional heart care, our providers are all part of one team.  This team includes your primary Cardiologist (physician) and Advanced Practice Providers or APPs (Physician Assistants and Nurse Practitioners) who all work together to provide you with the care you need, when you need it.  Your next appointment:   3 month(s)  Provider:   You may see Sammy Crisp, MD or one of the following Advanced Practice Providers on your designated Care Team:   Laneta Pintos, NP Gildardo Labrador, PA-C Varney Gentleman, PA-C Cadence Ple[place], PA-C Ronald Cockayne, NP Morey Ar, NP

## 2024-06-28 NOTE — Progress Notes (Unsigned)
 Cardiology Office Note:  .   Date:  06/29/2024  ID:  Tracey Mosley, DOB Jul 23, 1951, MRN [id number] PCP: Marikay Eva POUR, PA  [facility] Providers Cardiologist:  Lonni Hanson, MD     History of Present Illness: .   Tracey Mosley is a 73 y.o. female with history of hypertension, hyperlipidemia, hypothyroidism, and obesity, who presents for follow-up of dyspnea on exertion.  I last saw her in 08/2023, at which time she denied significant dyspnea.  Preceding echo and coronary CTA were reassuring.  We agreed to stop aspirin .  Otherwise, no medication changes were made.  Today, [name] reports that she has been under quite a bit of stress since our last visit.  Her husband died of septic shock in the setting of lymphoma in the spring.  She subsequently suffered a mechanical fall, leading to right scapular fracture and severe injury to the right shoulder.  She ultimately underwent hemiarthroplasty of the right shoulder at [facility] and remains in a brace with limited mobility.  She notes some exertional dyspnea at times, but attributes this to being less active due to her injuries.  She has not had any chest pain palpitations, or lightheadedness.  She notes some leg swelling and 1 there is if that could be related to her medications.  ROS: See HPI  Studies Reviewed: SABRA   EKG Interpretation Date/Time:  Wednesday June 28 2024 14:39:24 EDT Ventricular Rate:  73 PR Interval:  170 QRS Duration:  84 QT Interval:  390 QTC Calculation: 429 R Axis:   -20  Text Interpretation: Normal sinus rhythm Inferior infarct (cited on or before 04-Jul-2014) Anterolateral infarct (cited on or before 04-Jul-2014) Abnormal ECG When compared with ECG of 08-Sep-2023 10:23, No significant change was found Confirmed by Alexanderjames Berg, Lonni [phone]) on 06/29/2024 2:08:02 PM    Coronary CTA (02/11/2023): Mild disease in the proximal LAD (<25%).  CAC score 148 (77th percentile).  No significant extracardiac  findings.   TTE (01/15/2023): Normal LV size and wall thickness.  LVEF 60-65% with grade 1 diastolic dysfunction.  Normal RV size and function.  No pericardial effusion.  No significant valvular abnormalities.  Risk Assessment/Calculations:             Physical Exam:   VS:  BP 138/88 (BP Location: Left Arm, Patient Position: Sitting, Cuff Size: Normal)   Pulse 73   Wt 192 lb (87.1 kg)   SpO2 96%   BMI 30.99 kg/m    Wt Readings from Last 3 Encounters:  06/28/24 192 lb (87.1 kg)  11/25/23 190 lb (86.2 kg)  09/24/23 190 lb 12.8 oz (86.5 kg)    General:  NAD. Neck: Unable to assess JVP due to right shoulder brace. Lungs: Clear to auscultation bilaterally without wheezes or crackles. Heart: Regular rate and rhythm without murmurs, rubs, or gallops. Abdomen: Soft, nontender, nondistended. Extremities: Trace pretibial edema bilaterally.  ASSESSMENT AND PLAN: .    Nonobstructive coronary artery disease: Ms. [name] denies chest pain.  Her mild exertional dyspnea is likely due to deconditioning, as she has not been able to be as active over the last several months after her fall and resultant right shoulder/scapula injury.  Some degree of exertional dyspnea even predates her injury/fall.  She has trace pretibial edema on exam but otherwise appears euvolemic.  Echocardiogram and coronary CTA last year were both reassuring, though EKG today again shows anterior and inferior Q waves.  We will defer additional workup at this time, though if symptoms  progress, we may need to consider cardiac MRI and further workup for infiltrative cardiomyopathy.  However, I think it is best for her to recover from her shoulder injury/surgery and then reassess her symptoms.  Hypertension: Pressure borderline elevated today.  Defer medication changes.  Hyperlipidemia: Continue rosuvastatin  for target LDL less than 100 (ideally below 70) in the setting of mild CAD.    Dispo: Return to clinic in 3  months.  Signed, Lonni Hanson, MD

## 2024-06-29 ENCOUNTER — Encounter: Payer: Self-pay | Admitting: Internal Medicine

## 2024-06-30 ENCOUNTER — Other Ambulatory Visit: Payer: Self-pay | Admitting: Internal Medicine

## 2024-07-28 ENCOUNTER — Other Ambulatory Visit: Payer: Self-pay

## 2024-07-28 DIAGNOSIS — N200 Calculus of kidney: Secondary | ICD-10-CM

## 2024-07-31 ENCOUNTER — Other Ambulatory Visit: Payer: Self-pay | Admitting: Internal Medicine

## 2024-07-31 NOTE — Progress Notes (Signed)
11/04/2018  8:26 AM   [name] 13-Dec-1950 [id number]  Referring provider: Marikay Eva POUR, PA [address] [facility] [place],  [place] [address]  Urological history: 1. High risk hematuria -non-smoker -cysto NED 2019  -CTU 01/2019 The adrenal glands appear normal. Stone within the inferior pole of left kidney measures 5 mm, image 32/2.  No right renal calculi. No hydronephrosis identified bilaterally. No kidney mass. Urinary bladder appears within normal limits -cysto, 2022 - NED  -CTU (05/2023) - left renal stone  2. Nephrolithiasis -stone composition of 47% calcium  phosphate carbonate, 38% calcium  oxalate dihydrate and 15% calcium  oxalate monohydrate -ESWL x 3 -KUB (08/25/2022) 5 mm left lower pole renal stone   Chief Complaint  Patient presents with   Hematuria    HPI: RICCI PAFF is a 73 y.o. female who presents today for a 6 month follow up.   Previous records reviewed.     She is having 1 to 7 daytime voids, nocturia x 1-2, and a mild urge to void.  She has urge incontinence leaking 1-2 times a day.  She wears 1 absorbent pad only when she is away from the house.  She does not limit her fluid intake and she does not engage in toilet mapping.  Patient denies any modifying or aggravating factors.  Patient denies any recent UTI's, gross hematuria, dysuria or suprapubic/flank pain.  Patient denies any fevers, chills, nausea or vomiting.    She did referred her KUB today secondary to an orthopedic injury and has difficulty laying flat at this time.  She denies any issues with renal colic, gross hematuria or recent passage of fragments.   UA yellow clear, specific gravity 1.025, pH 6.0, 0-5 WBCs, 0-2 RBCs, and 0-10 epithelial cells  Serum creatinine (06/2024) 0.9, eGFR 68  Hemoglobin A1c (06/2024) 5.6  Diuretics: hydrochlorothiazide      PMH: Past Medical History:  Diagnosis Date   Abnormal ECG    a. inferior Q's, poor R progression -  dates back to 06/2014; b. 05/2017 MV: No ischemia/infarct.   History of echocardiogram    a. 04/2017 Echo: EF 55-60%, nl RV size/fxn.   History of kidney stones    Hyperlipidemia    Hypertension    Hypothyroidism    Microscopic hematuria    Osteoporosis    Thyroid  disease     Surgical History: Past Surgical History:  Procedure Laterality Date   CHOLECYSTECTOMY     COLONOSCOPY     COLONOSCOPY WITH PROPOFOL  N/A 09/24/2023   Procedure: COLONOSCOPY WITH PROPOFOL ;  Surgeon: Onita Elspeth Sharper, DO;  Location: [facility] ENDOSCOPY;  Service: Gastroenterology;  Laterality: N/A;   EXTRACORPOREAL SHOCK WAVE LITHOTRIPSY Left 09/09/2017   Procedure: EXTRACORPOREAL SHOCK WAVE LITHOTRIPSY (ESWL);  Surgeon: Twylla Glendia BROCKS, MD;  Location: [facility] ORS;  Service: Urology;  Laterality: Left;   EXTRACORPOREAL SHOCK WAVE LITHOTRIPSY Left 09/16/2017   Procedure: EXTRACORPOREAL SHOCK WAVE LITHOTRIPSY (ESWL);  Surgeon: Penne Knee, MD;  Location: [facility] ORS;  Service: Urology;  Laterality: Left;   LITHOTRIPSY     POLYPECTOMY  09/24/2023   Procedure: POLYPECTOMY;  Surgeon: Onita Elspeth Sharper, DO;  Location: [facility] ENDOSCOPY;  Service: Gastroenterology;;    Home Medications:  Allergies as of 08/01/2024       Reactions   Codeine Nausea Only   Codeine Sulfate Nausea Only   Epinephrine Other (See Comments)   Tremors   Penicillins Other (See Comments)   Sulfa Antibiotics Nausea And Vomiting  Medication List        Accurate as of August 01, 2024 11:59 PM. If you have any questions, ask your nurse or doctor.          amLODipine  5 MG tablet Commonly known as: NORVASC  TAKE 1 TABLET(5 MG) BY MOUTH DAILY   Cyanocobalamin 1000 MCG/ML Kit Inject as directed every 30 (thirty) days.   escitalopram 20 MG tablet Commonly known as: LEXAPRO TAKE 1 TABLET BY MOUTH ONCE DAILY   fluticasone 50 MCG/ACT nasal spray Commonly known as: FLONASE Place 1 spray into both nostrils daily.    hydrochlorothiazide  25 MG tablet Commonly known as: HYDRODIURIL  TAKE 1 TABLET BY MOUTH  DAILY   levothyroxine 50 MCG tablet Commonly known as: SYNTHROID Take 50 mcg by mouth daily before breakfast.   meloxicam 7.5 MG tablet Commonly known as: MOBIC Take 7.5 mg by mouth daily as needed for pain.   mometasone 0.1 % lotion Commonly known as: ELOCON Apply 1 Application topically as needed.   Multi-Vitamins Tabs Take 1 tablet by mouth daily.   rosuvastatin  20 MG tablet Commonly known as: CRESTOR  Take 1 tablet (20 mg total) by mouth daily.   valsartan  320 MG tablet Commonly known as: DIOVAN  TAKE 1 TABLET(320 MG) BY MOUTH DAILY        Allergies:  Allergies  Allergen Reactions   Codeine Nausea Only   Codeine Sulfate Nausea Only   Epinephrine Other (See Comments)    Tremors   Penicillins Other (See Comments)   Sulfa Antibiotics Nausea And Vomiting    Family History: Family History  Problem Relation Age of Onset   Cancer Brother        Abdomina   Heart disease Mother        leaky valves   Heart attack Father 67   Aneurysm Father    Breast cancer Neg Hx     Social History:  reports that she has never smoked. She has never used smokeless tobacco. She reports that she does not currently use alcohol after a past usage of about 2.0 standard drinks of alcohol per week. She reports that she does not use drugs.  ROS: For pertinent review of systems please refer to history of present illness  Physical Exam: BP 126/78   Pulse 80   Ht 5' 6 (1.676 m)   Wt 192 lb (87.1 kg)   BMI 30.99 kg/m   Constitutional:  Well nourished. Alert and oriented, No acute distress. HEENT: [place] AT, moist mucus membranes.  Trachea midline Cardiovascular: No clubbing, cyanosis, or edema. Respiratory: Normal respiratory effort, no increased work of breathing. Neurologic: Grossly intact, no focal deficits, moving all 4 extremities. Psychiatric: Normal mood and affect.    Laboratory  Data: See EPIC and HPI I have reviewed the labs.  Pertinent Imaging: KUB, radiology interpretation still pending I have independently reviewed the films.  See HPI.     Assessment & Plan:    1. High risk hematuria -non- smoker -CTU, 2020 - NED -cysto 2022 - NED -CTU 2024 - left renal stone  -no reports of gross heme  -UA negative for micro heme   2. Left lower pole stone - KUB deferred today, will call back when ready to schedule.   3. Mixed incontinence - Behavioral: continue bladder retraining and timed voiding  Return for Patient will call to schedule return appointment once recovered for orthopedic issues .  CLOTILDA HELON RIGGERS  [facility] [address] [place], [place]  [address] [phone]  

## 2024-08-01 ENCOUNTER — Ambulatory Visit (INDEPENDENT_AMBULATORY_CARE_PROVIDER_SITE_OTHER): Admitting: Urology

## 2024-08-01 ENCOUNTER — Encounter: Payer: Self-pay | Admitting: Urology

## 2024-08-01 VITALS — BP 126/78 | HR 80 | Ht 66.0 in | Wt 192.0 lb

## 2024-08-01 DIAGNOSIS — R319 Hematuria, unspecified: Secondary | ICD-10-CM | POA: Diagnosis not present

## 2024-08-01 DIAGNOSIS — N393 Stress incontinence (female) (male): Secondary | ICD-10-CM | POA: Diagnosis not present

## 2024-08-01 DIAGNOSIS — N200 Calculus of kidney: Secondary | ICD-10-CM

## 2024-08-01 NOTE — Patient Instructions (Signed)
 Call when you are ready to get your Xray

## 2024-08-02 LAB — MICROSCOPIC EXAMINATION: Bacteria, UA: NONE SEEN

## 2024-08-02 LAB — URINALYSIS, COMPLETE
Bilirubin, UA: NEGATIVE
Glucose, UA: NEGATIVE
Ketones, UA: NEGATIVE
Leukocytes,UA: NEGATIVE
Nitrite, UA: NEGATIVE
Protein,UA: NEGATIVE
RBC, UA: NEGATIVE
Specific Gravity, UA: 1.025 (ref 1.005–1.030)
Urobilinogen, Ur: 0.2 mg/dL (ref 0.2–1.0)
pH, UA: 6 (ref 5.0–7.5)

## 2024-09-27 ENCOUNTER — Other Ambulatory Visit: Payer: Self-pay | Admitting: Internal Medicine

## 2024-09-30 NOTE — Progress Notes (Unsigned)
 "  Cardiology Clinic Note   Date: 10/02/2024 ID: Tracey Mosley, DOB 12/11/1950, MRN [id number]  Primary Cardiologist:  Lonni Hanson, MD  Chief Complaint   Tracey Mosley is a 74 y.o. female who presents to the clinic today for routine follow up.   Patient Profile   Tracey Mosley is followed by Dr. Hanson for the history outlined below.      Past medical history significant for: Lower extremity edema/DOE. Echo 01/15/2023: EF 60 to 65%.  No RWMA.  Grade I DD.  Normal RV size/function.  No significant valvular abnormalities. Nonobstructive CAD. Coronary CTA 02/11/2023: Coronary calcium  score 148 (77th percentile).  Minimal proximal LAD stenosis. Hypertension. Hyperlipidemia. Lipid panel 07/12/2024: LDL 63, HDL 48, TG 188, total 148. CKD stage III. Hypothyroidism.  In summary, patient was first evaluated by Dr. Hanson on 05/06/2017 for uncontrolled hypertension.  She had been seen by PCP today prior and was noted to have elevated BP at 184/98.  She reported a 20-year history of hypertension well-controlled on lisinopril until 3 to 4 months prior.  At that time she began having intermittent headaches and noted blood pressure was frequently elevated.  Lisinopril was discontinued and several agents tried by PCP.  Home BP was running 160/60-90.  She had most recently been started on hydralazine and nifedipine  with no significant improvement in blood pressure.  She reported progressive lower extremity edema over the last several months with exertional dyspnea particularly walking or going up stairs.  Hydrochlorothiazide  was added to her medication regimen.  She had a normal renal artery ultrasound.  She had a low risk stress test.  Upon follow-up she reported improved blood pressure.  She reported continued lower extremity edema.  Nifedipine  was decreased and she was instructed to closely monitor BP.  In April 2024 patient reported occasional chest tightness and DOE.  She underwent echo which demonstrated  normal LV/RV function.  Coronary CTA showed calcium  score 140.  Aspirin  was stopped.  Patient was last seen in the office by Dr. Hanson on 06/28/2024 for routine follow-up.  She reported increased stress in the setting of the loss of her husband to septic shock secondary to lymphoma.  She had suffered a mechanical fall leading to right scapular fracture and severe injury to right shoulder.  She underwent hemiarthroplasty of the right shoulder at [facility] and was in a brace with limited mobility at the time of her visit.  She reported DOE that she attributed to being less active.  No chest pain palpitations or lightheadedness.  She reported continued lower extremity edema.  EKG demonstrated anterior inferior Q waves.  Further workup was deferred until she recovered from her shoulder surgery.  If dyspnea and edema persisted cardiac MRI for infiltrative cardiomyopathy should be considered.     History of Present Illness    Today, patient is here alone. She reports overall she is doing well. She continues to work with PT twice a week for her right shoulder. Her sister in law was staying with her to help around the house up until last week. She is independent with ADLs and is able to perform light household activities. She admits that other than PT and some seated home exercises for her shoulder she is mostly sedentary. She continues to have DOE unchanged from previous and maybe slightly improved. She does not become dyspneic with showering, dressing or light household chores. She has 15 steps in her home and is able to navigate them without significant dyspnea. She does  get dyspneic with heavier exertion or if she rushes. She became dyspneic rushing through the parking lot and into the building today but no dyspnea walking from the waiting room to the exam room. She reports lower extremity edema is improved. She is drinking more water and urinating more. She notes edema is not present in the morning and progresses  throughout the day. She has been sleeping on a recliner since her fall in May but is able to lay flat on her side on the couch with no dyspnea. She denies chest pain, pressure or tightness. No lightheadedness or palpitations.     ROS: All other systems reviewed and are otherwise negative except as noted in History of Present Illness.  EKGs/Labs Reviewed    EKG Interpretation Date/Time:  Monday October 02 2024 13:42:47 EST Ventricular Rate:  85 PR Interval:  166 QRS Duration:  88 QT Interval:  388 QTC Calculation: 461 R Axis:   -25  Text Interpretation: Normal sinus rhythm Inferior infarct (cited on or before 04-Jul-2014) Anterolateral infarct (cited on or before 08-Sep-2023) When compared with ECG of 28-Jun-2024 14:39, No significant change was found Confirmed by Loistine Sober [phone]) on 10/02/2024 1:45:09 PM    Physical Exam    VS:  BP 116/72 (BP Location: Left Arm, Patient Position: Sitting, Cuff Size: Normal)   Pulse 85   Ht 5' 6 (1.676 m)   Wt 196 lb (88.9 kg)   SpO2 96%   BMI 31.64 kg/m  , BMI Body mass index is 31.64 kg/m.  GEN: Well nourished, well developed, in no acute distress. Neck: No JVD or carotid bruits. Cardiac:  RRR.  No murmur. No rubs or gallops.   Respiratory:  Respirations regular and unlabored. Clear to auscultation without rales, wheezing or rhonchi. GI: Soft, nontender, nondistended. Extremities: Radials/DP/PT 2+ and equal bilaterally. No clubbing or cyanosis. Mild nonpitting edema L>R lower extremities.   Skin: Warm and dry, no rash. Neuro: Strength intact.  Assessment & Plan   Nonobstructive CAD Coronary CTA May 2024 demonstrated calcium  score 148 with minimal proximal LAD stenosis.  Patient denies chest pain, pressure or tightness. She has been mostly sedentary since May when she fell. She is working with PT 2 days a week and doing some seated home exercises. Discussed adding in some light walks to regimen. She reports PT has suggested the  same. EKG without acute changes.  - Continue rosuvastatin .  DOE/lower extremity edema Echo April 2024 demonstrated EF 60 to 65%, no RWMA, Grade I DD, normal RV size/function, no significant valvular abnormalities.  Patient reports continued DOE unchanged from previous visit and maybe slightly improved. She does not become dyspneic with showering, dressing, or light household chores. She is able to navigate 15 steps at home without significant dyspnea. She will become dyspneic with heavier exertion or when she rushes. She reports lower extremity edema has improved since she increased her hydration and is urinating more. She wakes with no edema and it will progress throughout the day. It improves if she is able to elevate her legs. Discussed persuing an MRI. She does not think she will be able to lay on the MRI table right now, as she still has discomfort in her scapula and shoulder. She is working hard with PT and hopes at follow up she will be able to consider it. Mild nonpitting edema L>R on exam today otherwise euvolemic and well compensated on exam.  - Continue conservative management of edema with compression and elevation.  - Consider cardiac  MRI at follow up.   Hypertension BP today 116/72. No report of lightheadedness or dizziness.  - Continue valsartan , hydrochlorothiazide , amlodipine .  Hyperlipidemia LDL 63 October 2025, at goal. - Continue rosuvastatin .  Disposition: Return in 3 months or sooner as needed.          Signed, Barnie HERO. Tarryn Bogdan, DNP, NP-C  "

## 2024-10-02 ENCOUNTER — Ambulatory Visit: Attending: Student | Admitting: Student

## 2024-10-02 ENCOUNTER — Encounter: Payer: Self-pay | Admitting: Student

## 2024-10-02 VITALS — BP 116/72 | HR 85 | Ht 66.0 in | Wt 196.0 lb

## 2024-10-02 DIAGNOSIS — I10 Essential (primary) hypertension: Secondary | ICD-10-CM | POA: Diagnosis not present

## 2024-10-02 DIAGNOSIS — I2510 Atherosclerotic heart disease of native coronary artery without angina pectoris: Secondary | ICD-10-CM | POA: Diagnosis not present

## 2024-10-02 DIAGNOSIS — R0609 Other forms of dyspnea: Secondary | ICD-10-CM | POA: Diagnosis not present

## 2024-10-02 DIAGNOSIS — E785 Hyperlipidemia, unspecified: Secondary | ICD-10-CM | POA: Insufficient documentation

## 2024-10-02 DIAGNOSIS — R600 Localized edema: Secondary | ICD-10-CM | POA: Diagnosis not present

## 2024-10-02 NOTE — Patient Instructions (Signed)
 Medication Instructions:  Your physician recommends that you continue on your current medications as directed. Please refer to the Current Medication list given to you today.   *If you need a refill on your cardiac medications before your next appointment, please call your pharmacy*  Lab Work: No labs ordered today  If you have labs (blood work) drawn today and your tests are completely normal, you will receive your results only by: MyChart Message (if you have MyChart) OR A paper copy in the mail If you have any lab test that is abnormal or we need to change your treatment, we will call you to review the results.  Testing/Procedures: No test ordered today   Follow-Up: At [facility], you and your health needs are our priority.  As part of our continuing mission to provide you with exceptional heart care, our providers are all part of one team.  This team includes your primary Cardiologist (physician) and Advanced Practice Providers or APPs (Physician Assistants and Nurse Practitioners) who all work together to provide you with the care you need, when you need it.  Your next appointment:   3 month(s)  Provider:   Lonni Hanson, MD or Barnie Hila, NP    We recommend signing up for the patient portal called MyChart.  Sign up information is provided on this After Visit Summary.  MyChart is used to connect with patients for Virtual Visits (Telemedicine).  Patients are able to view lab/test results, encounter notes, upcoming appointments, etc.  Non-urgent messages can be sent to your provider as well.   To learn more about what you can do with MyChart, go to [email or web address].

## 2024-12-20 ENCOUNTER — Ambulatory Visit: Admitting: Internal Medicine
